# Patient Record
Sex: Female | Born: 1990 | Race: Black or African American | Hispanic: No | Marital: Single | Smoking: Current every day smoker
Health system: Southern US, Community
[De-identification: ages and names within clinical notes are randomized; demographics above are authoritative.]

## PROBLEM LIST (undated history)

## (undated) HISTORY — PX: HERNIA REPAIR: SHX51

---
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
## 2013-02-16 ENCOUNTER — Emergency Department (INDEPENDENT_AMBULATORY_CARE_PROVIDER_SITE_OTHER)
Admission: EM | Admit: 2013-02-16 | Discharge: 2013-02-16 | Disposition: A | Discharge status: Home / Self Care | Payer: Managed Care, Other (non HMO) | Source: Home / Self Care | Attending: Emergency Medicine | Admitting: Emergency Medicine

## 2013-02-16 ENCOUNTER — Encounter (HOSPITAL_COMMUNITY): Payer: Self-pay | Admitting: Emergency Medicine

## 2013-02-16 DIAGNOSIS — K1370 Unspecified lesions of oral mucosa: Secondary | ICD-10-CM

## 2013-02-16 DIAGNOSIS — N390 Urinary tract infection, site not specified: Secondary | ICD-10-CM

## 2013-02-16 DIAGNOSIS — K121 Other forms of stomatitis: Secondary | ICD-10-CM

## 2013-02-16 DIAGNOSIS — N912 Amenorrhea, unspecified: Secondary | ICD-10-CM

## 2013-02-16 LAB — POCT URINALYSIS DIP (DEVICE)
Bilirubin Urine: NEGATIVE
Glucose, UA: NEGATIVE mg/dL
KETONES UR: NEGATIVE mg/dL
Nitrite: POSITIVE — AB
PROTEIN: NEGATIVE mg/dL
Specific Gravity, Urine: 1.025 (ref 1.005–1.030)
Urobilinogen, UA: 0.2 mg/dL (ref 0.0–1.0)
pH: 6 (ref 5.0–8.0)

## 2013-02-16 LAB — POCT PREGNANCY, URINE: PREG TEST UR: NEGATIVE

## 2013-02-16 MED ORDER — HYDROCODONE-ACETAMINOPHEN 5-325 MG PO TABS: 1.0000 | ORAL_TABLET | ORAL | Status: DC | PRN

## 2013-02-16 MED ORDER — CEPHALEXIN 500 MG PO CAPS: 500.0000 mg | ORAL_CAPSULE | Freq: Three times a day (TID) | ORAL | Status: DC

## 2013-02-16 MED ORDER — CEFUROXIME AXETIL 500 MG PO TABS: 500.0000 mg | ORAL_TABLET | Freq: Two times a day (BID) | ORAL | Status: DC

## 2013-02-16 MED ORDER — VALACYCLOVIR HCL 1 G PO TABS: 1000.0000 mg | ORAL_TABLET | Freq: Two times a day (BID) | ORAL | Status: DC

## 2013-02-16 MED ORDER — VALACYCLOVIR HCL 1 G PO TABS: 1000.0000 mg | ORAL_TABLET | Freq: Three times a day (TID) | ORAL | Status: DC

## 2013-02-16 NOTE — ED Notes (Signed)
Call back number verified.

## 2013-02-16 NOTE — ED Provider Notes (Signed)
CSN: [id number]     Arrival date & time 02/16/13  1034 History   First MD Initiated Contact with Patient 02/16/13 1101     Chief Complaint  Patient presents with   Oral Swelling   (Consider location/radiation/quality/duration/timing/severity/associated sxs/prior Treatment) HPI Comments: 23 year old female presents complaining of mouth pain and amenorrhea. These are 2 separate complaints. She has not had a period since November and she worries that she may be pregnant. She has not done a urine pregnancy tests at home. Also, for 4 days, she is lip ulcerations and pain. This started when she accidentally bit her lower lip. Spread around the sides to the top lip, and she also has a small spot on her tongue. The area is very painful and swollen. She denies any other systemic symptoms. She has no rash. She is not taking any medications recently. She has never had this before. She has no history of cold sores.   History reviewed. No pertinent past medical history. History reviewed. No pertinent past surgical history. No family history on file. History  Substance Use Topics   Smoking status: Current Every Day Smoker    Types: Cigarettes   Smokeless tobacco: Not on file   Alcohol Use: Yes   OB History   Grav Para Term Preterm Abortions TAB SAB Ect Mult Living   1              Review of Systems  Constitutional: Negative for fever and chills.  HENT: Positive for mouth sores (and pain). Negative for sore throat.   Eyes: Negative for visual disturbance.  Respiratory: Negative for cough and shortness of breath.   Cardiovascular: Negative for chest pain, palpitations and leg swelling.  Gastrointestinal: Negative for nausea, vomiting and abdominal pain.  Endocrine: Negative for polydipsia and polyuria.  Genitourinary: Positive for menstrual problem. Negative for dysuria, urgency, frequency, vaginal discharge and genital sores.  Musculoskeletal: Negative for arthralgias and myalgias.  Skin:  Negative for rash.  Neurological: Negative for dizziness, weakness and light-headedness.    Allergies  Review of patient's allergies indicates no known allergies.  Home Medications   Current Outpatient Rx  Name  Route  Sig  Dispense  Refill   cefUROXime (CEFTIN) 500 MG tablet   Oral   Take 1 tablet (500 mg total) by mouth 2 (two) times daily with a meal.   20 tablet   0    HYDROcodone-acetaminophen (NORCO/VICODIN) 5-325 MG per tablet   Oral   Take 1 tablet by mouth every 4 (four) hours as needed for moderate pain.   20 tablet   0    valACYclovir (VALTREX) 1000 MG tablet   Oral   Take 1 tablet (1,000 mg total) by mouth 2 (two) times daily.   20 tablet   0    BP 135/96   Pulse 77   Temp(Src) 98.6 F (37 C) (Oral)   Resp 16   SpO2 100%   LMP 12/10/2012 Physical Exam  Nursing note and vitals reviewed. Constitutional: She is oriented to person, place, and time. Vital signs are normal. She appears well-developed and well-nourished. No distress.  HENT:  Head: Normocephalic and atraumatic.  Mouth/Throat: Oral lesions (multiple tender induated lesions on the upper and lower lip, and buccal mucosa ) present.  Neck: Normal range of motion. Neck supple.  Pulmonary/Chest: Effort normal. No respiratory distress.  Abdominal: Soft. Normal appearance. There is no tenderness.  Lymphadenopathy:       Head (right side): Tonsillar adenopathy present.  Head (left side): Tonsillar adenopathy present.    She has cervical adenopathy.       Right cervical: Superficial cervical adenopathy present.       Left cervical: Superficial cervical adenopathy present.       Left: Supraclavicular adenopathy present.  Neurological: She is alert and oriented to person, place, and time. She has normal strength. Coordination normal.  Skin: Skin is warm and dry. No rash noted. She is not diaphoretic.  Psychiatric: She has a normal mood and affect. Judgment normal.    ED Course  Procedures  (including critical care time) Labs Review Labs Reviewed  POCT URINALYSIS DIP (DEVICE) - Abnormal; Notable for the following:    Hgb urine dipstick TRACE (*)    Nitrite POSITIVE (*)    Leukocytes, UA TRACE (*)    All other components within normal limits  HERPES SIMPLEX VIRUS CULTURE  URINE CULTURE  POCT PREGNANCY, URINE   Imaging Review No results found.    MDM   1. Amenorrhea   2. Mouth ulcers   3. UTI (lower urinary tract infection)    Pt seen with attending Dr. Lorenz CoasterKeller.  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  UPT negative, f/u with PCP, no other clear cause seen here, f/u with PCP.  UA does indicated UTI, treating for that as well.    New Prescriptions   CEFUROXIME (CEFTIN) 500 MG TABLET    Take 1 tablet (500 mg total) by mouth 2 (two) times daily with a meal.   HYDROCODONE-ACETAMINOPHEN (NORCO/VICODIN) 5-325 MG PER TABLET    Take 1 tablet by mouth every 4 (four) hours as needed for moderate pain.   VALACYCLOVIR (VALTREX) 1000 MG TABLET    Take 1 tablet (1,000 mg total) by mouth 2 (two) times daily.     Graylon GoodZachary H Lyndsee Casa, PA-C 02/16/13 1200

## 2013-02-16 NOTE — ED Provider Notes (Signed)
Medical screening examination/treatment/procedure(s) were performed by non-physician practitioner and as supervising physician I was immediately available for consultation/collaboration.  Leslee Homeavid Kunta Hilleary, M.D.   Reuben Likesavid C Dawayne Ohair, MD 02/16/13 [phone]

## 2013-02-16 NOTE — ED Notes (Signed)
Pt c/o oral swelling onset 4 days w/blisters on upper/lower lips Sxs include pain/tender Also concerned about poss preg LMP = 12/10/12 She is alert w/no signs of acute distress.

## 2013-02-16 NOTE — Discharge Instructions (Signed)
Oral Ulcers Oral ulcers are painful, shallow sores around the lining of the mouth. They can affect the gums, the inside of the lips and the cheeks (sores on the outside of the lips and on the face are different). They typically first occur in school aged children and teenagers. Oral ulcers may also be called canker sores or cold sores. CAUSES  Canker sores and cold sores can be caused by many factors including:  Infection.  Injury.  Sun exposure.  Medications.  Emotional stress.  Food allergies.  Vitamin deficiencies.  Toothpastes containing sodium lauryl sulfate. The Herpes Virus can be the cause of mouth ulcers. The first infection can be severe and cause 10 or more ulcers on the gums, tongue and lips with fever and difficulty in swallowing. This infection usually occurs between the ages of 1 and 3 years.  SYMPTOMS  The typical sore is about  inch (6 mm) in size, is an oval or round ulcer with red borders. DIAGNOSIS  Your caregiver can diagnose simple oral ulcers by examination. Additional testing is usually not required.  TREATMENT  Treatment is aimed at pain relief. Generally, oral ulcers resolve by themselves within 1 to 2 weeks without medication and are not contagious unless caused by Herpes (and other viruses). Antibiotics are not effective with mouth sores. Avoid direct contact with others until the ulcer is completely healed. See your caregiver for follow-up care as recommended. Also:  Offer a soft diet.  Encourage plenty of fluids to prevent dehydration. Popsicles and milk shakes can be helpful.  Avoid acidic and salty foods and drinks such as orange juice.  Infants and young children will often refuse to drink because of pain. Using a teaspoon, cup or syringe to give small amounts of fluids frequently can help prevent dehydration.  Cold compresses on the face may help reduce pain.  Pain medication can help control soreness.  A solution of diphenhydramine mixed  with a liquid antacid can be useful to decrease the soreness of ulcers. Consult a caregiver for the dosing.  Liquids or ointments with a numbing ingredient may be helpful when used as recommended.  Older children and teenagers can rinse their mouth with a salt-water mixture (1/2 teaspoonof salt in 8 ounces of water) four times a day. This treatment is uncomfortable but may reduce the time the ulcers are present.  There are many over the counter throat lozenges and medications available for oral ulcers. There effectiveness has not been studied.  Consult your medical caregiver prior to using homeopathic treatments for oral ulcers. SEEK MEDICAL CARE IF:   You think your child needs to be seen.  The pain worsens and you cannot control it.  There are 4 or more ulcers.  The lips and gums begin to bleed and crust.  A single mouth ulcer is near a tooth that is causing a toothache or pain.  Your child has a fever, swollen face, or swollen glands.  The ulcers began after starting a medication.  Mouth ulcers keep re-occurring or last more than 2 weeks.  You think your child is not taking adequate fluids. SEEK IMMEDIATE MEDICAL CARE IF:   Your child has a high fever.  Your child is unable to swallow or becomes dehydrated.  Your child looks or acts very ill.  An ulcer caused by a chemical your child accidentally put in their mouth. Document Released: 02/20/2004 Document Revised: 04/06/2011 Document Reviewed: 10/04/2008 [facility] Patient Information 2014 [place], [place].  Secondary Amenorrhea  Secondary amenorrhea is  the stopping of menstrual flow for 3 6 months in a female who has previously had periods. There are many possible causes. Most of these causes are not serious. Usually, treating the underlying problem causing the loss of menses will return your periods to normal. CAUSES  Some common and uncommon causes of not menstruating include:  Malnutrition.  Low blood sugar  (hypoglycemia).  Polycystic ovary disease.  Stress or fear.  Breastfeeding.  Hormone imbalance.  Ovarian failure.  Medicines.  Extreme obesity.  Cystic fibrosis.  Low body weight or drastic weight reduction from any cause.  Early menopause.  Removal of ovaries or uterus.  Contraceptives.  Illness.  Long-term (chronic) illnesses.  Cushing syndrome.  Thyroid problems.  Birth control pills, patches, or vaginal rings for birth control. RISK FACTORS You may be at greater risk of secondary amenorrhea if:  You have a family history of this condition.  You have an eating disorder.  You do athletic training. DIAGNOSIS  A diagnosis is made by your health care provider taking a medical history and doing a physical exam. This will include a pelvic exam to check for problems with your reproductive organs. Pregnancy must be ruled out. Often, numerous blood tests are done to measure different hormones in the body. Urine testing may be done. Specialized exams (ultrasound, CT scan, MRI, or hysteroscopy) may have to be done as well as measuring the body mass index (BMI). TREATMENT  Treatment depends on the cause of the amenorrhea. If an eating disorder is present, this can be treated with an adequate diet and therapy. Chronic illnesses may improve with treatment of the illness. Amenorrhea may be corrected with medicines, lifestyle changes, or surgery. If the amenorrhea cannot be corrected, it is sometimes possible to create a false menstruation with medicines. HOME CARE INSTRUCTIONS  Maintain a healthy diet.  Manage weight problems.  Exercise regularly but not excessively.  Get adequate sleep.  Manage stress.  Be aware of changes in your menstrual cycle. Keep a record of when your periods occur. Note the date your period starts, how long it lasts, and any problems. SEEK MEDICAL CARE IF: Your symptoms do not get better with treatment. Document Released: 02/23/2006 Document  Revised: 09/14/2012 Document Reviewed: 06/30/2012 [facility] Patient Information 2014 [place].

## 2013-02-18 LAB — URINE CULTURE: Colony Count: >=100000

## 2013-02-19 NOTE — ED Notes (Signed)
Urine culture: >100,000 colonies E. Coli.  Pt. adequately treated with Ceftin.  Herpes culture pending. Vassie MoselleYork, Syerra Abdelrahman M 02/19/2013

## 2013-02-20 LAB — HERPES SIMPLEX VIRUS CULTURE

## 2013-02-21 ENCOUNTER — Telehealth (HOSPITAL_COMMUNITY): Payer: Self-pay | Admitting: *Deleted

## 2013-02-21 NOTE — ED Notes (Signed)
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  Pt. adequately treated with Valtrex.  I called pt. and left a message to call.  Call 1. Vassie MoselleYork, Estuardo Frisbee M 02/21/2013

## 2013-02-25 NOTE — ED Notes (Signed)
Unable to reach pt. by phone x 3.  Confidential marked letter sent with results and instructions. Vassie MoselleYork, Jeanelle Dake M 02/25/2013

## 2013-03-01 NOTE — ED Notes (Addendum)
Pt. called back.  States she has not received the letter yet.  States her phone was out of order and just got the messages.  Pt. verified x 2 and given results.  Pt. told she was adequately treated with Valtrex for the Herpes and Ceftin for the UTI.  Pt. instructed to notify her partner. You can pass the virus to a partner's genital area, even when you don't have an outbreak, so always practice safe sex. Get treated for each outbreak with Acyclovir or Valtrex. Pt. has a PCP she can f/u with, who can recheck her next outbreak for typing and call in a prescription for her when she has an outbreak. Vassie MoselleYork, Damondre Pfeifle M 03/01/2013

## 2013-03-03 NOTE — Laboratory Report (Signed)
°

## 2013-05-09 ENCOUNTER — Ambulatory Visit: Payer: Managed Care, Other (non HMO) | Admitting: Family Medicine

## 2013-09-27 ENCOUNTER — Encounter (HOSPITAL_COMMUNITY): Payer: Self-pay | Admitting: Emergency Medicine

## 2013-09-27 ENCOUNTER — Emergency Department (HOSPITAL_COMMUNITY)
Admission: EM | Admit: 2013-09-27 | Discharge: 2013-09-27 | Disposition: A | Discharge status: Home / Self Care | Payer: Managed Care, Other (non HMO) | Attending: Emergency Medicine | Admitting: Emergency Medicine

## 2013-09-27 ENCOUNTER — Emergency Department (HOSPITAL_COMMUNITY): Payer: Managed Care, Other (non HMO)

## 2013-09-27 DIAGNOSIS — Z791 Long term (current) use of non-steroidal anti-inflammatories (NSAID): Secondary | ICD-10-CM | POA: Diagnosis not present

## 2013-09-27 DIAGNOSIS — O9989 Other specified diseases and conditions complicating pregnancy, childbirth and the puerperium: Secondary | ICD-10-CM

## 2013-09-27 DIAGNOSIS — O99330 Smoking (tobacco) complicating pregnancy, unspecified trimester: Secondary | ICD-10-CM | POA: Insufficient documentation

## 2013-09-27 DIAGNOSIS — M259 Joint disorder, unspecified: Secondary | ICD-10-CM | POA: Insufficient documentation

## 2013-09-27 DIAGNOSIS — N390 Urinary tract infection, site not specified: Secondary | ICD-10-CM | POA: Diagnosis not present

## 2013-09-27 DIAGNOSIS — Z79899 Other long term (current) drug therapy: Secondary | ICD-10-CM | POA: Diagnosis not present

## 2013-09-27 DIAGNOSIS — O2390 Unspecified genitourinary tract infection in pregnancy, unspecified trimester: Secondary | ICD-10-CM | POA: Insufficient documentation

## 2013-09-27 DIAGNOSIS — M545 Low back pain, unspecified: Secondary | ICD-10-CM | POA: Insufficient documentation

## 2013-09-27 DIAGNOSIS — M899 Disorder of bone, unspecified: Secondary | ICD-10-CM

## 2013-09-27 DIAGNOSIS — Z3490 Encounter for supervision of normal pregnancy, unspecified, unspecified trimester: Secondary | ICD-10-CM

## 2013-09-27 LAB — URINALYSIS, ROUTINE W REFLEX MICROSCOPIC
BILIRUBIN URINE: NEGATIVE
GLUCOSE, UA: NEGATIVE mg/dL
HGB URINE DIPSTICK: NEGATIVE
Ketones, ur: NEGATIVE mg/dL
Nitrite: NEGATIVE
PH: 7.5 (ref 5.0–8.0)
Protein, ur: NEGATIVE mg/dL
Specific Gravity, Urine: 1.024 (ref 1.005–1.030)
UROBILINOGEN UA: 4 mg/dL — AB (ref 0.0–1.0)

## 2013-09-27 LAB — WET PREP, GENITAL
CLUE CELLS WET PREP: NONE SEEN
TRICH WET PREP: NONE SEEN
WBC, Wet Prep HPF POC: NONE SEEN
Yeast Wet Prep HPF POC: NONE SEEN

## 2013-09-27 LAB — URINE MICROSCOPIC-ADD ON

## 2013-09-27 LAB — HCG, QUANTITATIVE, PREGNANCY: hCG, Beta Chain, Quant, S: 90940 m[IU]/mL — ABNORMAL HIGH (ref <5)

## 2013-09-27 LAB — ABO/RH: ABO/RH(D): O POS

## 2013-09-27 LAB — POC URINE PREG, ED: PREG TEST UR: POSITIVE — AB

## 2013-09-27 MED ORDER — NITROFURANTOIN MONOHYD MACRO 100 MG PO CAPS: 100.0000 mg | ORAL_CAPSULE | Freq: Two times a day (BID) | ORAL | Status: DC

## 2013-09-27 NOTE — ED Provider Notes (Signed)
CSN: [id number]     Arrival date & time 09/27/13  1419 History   First MD Initiated Contact with Patient 09/27/13 1504     Chief Complaint  Patient presents with   Pregnancy Test    Back Pain     (Consider location/radiation/quality/duration/timing/severity/associated sxs/prior Treatment) HPI Comments: Patient presents with right back and lower abdominal pain. She took a home pregnancy test was positive. Her last visual. He was in mid July. This is her third pregnancy. She has one child and she had one prior elective abortion. She's had some constant aching pain to her right back and some mild pain to her right abdomen over the last 3-4 days. She denies any vaginal discharge. She denies any bleeding. She's had some nausea and vomiting. She's had a no burning on urination or frequency.  Patient is a 23 y.o. female presenting with back pain.  Back Pain Associated symptoms: abdominal pain   Associated symptoms: no chest pain, no fever, no headaches, no numbness and no weakness     History reviewed. No pertinent past medical history. No past surgical history on file. No family history on file. History  Substance Use Topics   Smoking status: Current Every Day Smoker    Types: Cigarettes   Smokeless tobacco: Not on file   Alcohol Use: Yes   OB History   Grav Para Term Preterm Abortions TAB SAB Ect Mult Living   1              Review of Systems  Constitutional: Negative for fever, chills, diaphoresis and fatigue.  HENT: Negative for congestion, rhinorrhea and sneezing.   Eyes: Negative.   Respiratory: Negative for cough, chest tightness and shortness of breath.   Cardiovascular: Negative for chest pain and leg swelling.  Gastrointestinal: Positive for nausea, vomiting and abdominal pain. Negative for diarrhea and blood in stool.  Genitourinary: Negative for frequency, hematuria, flank pain, vaginal bleeding, vaginal discharge and difficulty urinating.  Musculoskeletal: Positive  for back pain. Negative for arthralgias.  Skin: Negative for rash.  Neurological: Negative for dizziness, speech difficulty, weakness, numbness and headaches.      Allergies  Review of patient's allergies indicates no known allergies.  Home Medications   Prior to Admission medications   Medication Sig Start Date End Date Taking? Authorizing Provider  naproxen sodium (ANAPROX) 220 MG tablet Take 440-660 mg by mouth 2 (two) times daily with a meal.   Yes Historical Provider, MD  nitrofurantoin, macrocrystal-monohydrate, (MACROBID) 100 MG capsule Take 1 capsule (100 mg total) by mouth 2 (two) times daily. X 7 days 09/27/13   Rolan Bucco, MD   BP 113/65   Pulse 91   Temp(Src) 98 F (36.7 C) (Oral)   Resp 16   SpO2 100%   LMP 08/09/2013 Physical Exam  Constitutional: She is oriented to person, place, and time. She appears well-developed and well-nourished.  HENT:  Head: Normocephalic and atraumatic.  Eyes: Pupils are equal, round, and reactive to light.  Neck: Normal range of motion. Neck supple.  Cardiovascular: Normal rate, regular rhythm and normal heart sounds.   Pulmonary/Chest: Effort normal and breath sounds normal. No respiratory distress. She has no wheezes. She has no rales. She exhibits no tenderness.  Abdominal: Soft. Bowel sounds are normal. There is tenderness (mild TTP right lower abd). There is no rebound and no guarding.  Mild TTP right lower lumbar area  Musculoskeletal: Normal range of motion. She exhibits no edema.  Lymphadenopathy:    She has  no cervical adenopathy.  Neurological: She is alert and oriented to person, place, and time.  Skin: Skin is warm and dry. No rash noted.  Psychiatric: She has a normal mood and affect.    ED Course  Procedures (including critical care time) Labs Review Results for orders placed during the hospital encounter of 09/27/13  WET PREP, GENITAL      Result Value Ref Range   Yeast Wet Prep HPF POC NONE SEEN  NONE SEEN   Trich,  Wet Prep NONE SEEN  NONE SEEN   Clue Cells Wet Prep HPF POC NONE SEEN  NONE SEEN   WBC, Wet Prep HPF POC NONE SEEN  NONE SEEN  URINALYSIS, ROUTINE W REFLEX MICROSCOPIC      Result Value Ref Range   Color, Urine YELLOW  YELLOW   APPearance TURBID (*) CLEAR   Specific Gravity, Urine 1.024  1.005 - 1.030   pH 7.5  5.0 - 8.0   Glucose, UA NEGATIVE  NEGATIVE mg/dL   Hgb urine dipstick NEGATIVE  NEGATIVE   Bilirubin Urine NEGATIVE  NEGATIVE   Ketones, ur NEGATIVE  NEGATIVE mg/dL   Protein, ur NEGATIVE  NEGATIVE mg/dL   Urobilinogen, UA 4.0 (*) 0.0 - 1.0 mg/dL   Nitrite NEGATIVE  NEGATIVE   Leukocytes, UA MODERATE (*) NEGATIVE  HCG, QUANTITATIVE, PREGNANCY      Result Value Ref Range   hCG, Beta Chain, Quant, S 90940 (*) <5 mIU/mL  URINE MICROSCOPIC-ADD ON      Result Value Ref Range   Squamous Epithelial / LPF RARE  RARE   WBC, UA 11-20  <3 WBC/hpf  POC URINE PREG, ED      Result Value Ref Range   Preg Test, Ur POSITIVE (*) NEGATIVE  ABO/RH      Result Value Ref Range   ABO/RH(D) O POS     No rh immune globuloin NOT A RH IMMUNE GLOBULIN CANDIDATE, PT RH POSITIVE     No results found.    Imaging Review US Ob Comp Less 14 Wks  09/27/2013   CLINICAL DATA:  Positive pregnancy test.  EXAM: OBSTETRIC <14 WK [place] AND TRANSVAGINAL OB US  TECHNIQUE: Both transabdominal and transvaginal ultrasound examinations were performed for complete evaluation of the gestation as well as the maternal uterus, adnexal regions, and pelvic cul-de-sac. Transvaginal technique was performed to assess early pregnancy.  COMPARISON:  No recent.  FINDINGS: Intrauterine gestational sac: Visualized/normal in shape.  Yolk sac:  Visualized.  Embryo:  Visualized.  Cardiac Activity: Visualized.  Heart Rate:  144 bpm  CRL:   1.2 cm 7 w 3 d                  [place] EDC: 05/12/2013.  Maternal uterus/adnexae: 2.8 cm thick wall cyst left ovary, most likely corpus luteal cyst. Trace free pelvic fluid.  IMPRESSION: Single viable  intrauterine pregnancy at 7 weeks 3 days.   Electronically Signed   By: Maisie Fus  Register   On: 09/27/2013 16:29   US Ob Transvaginal  09/27/2013   CLINICAL DATA:  Positive pregnancy test.  EXAM: OBSTETRIC <14 WK [place] AND TRANSVAGINAL OB US  TECHNIQUE: Both transabdominal and transvaginal ultrasound examinations were performed for complete evaluation of the gestation as well as the maternal uterus, adnexal regions, and pelvic cul-de-sac. Transvaginal technique was performed to assess early pregnancy.  COMPARISON:  No recent.  FINDINGS: Intrauterine gestational sac: Visualized/normal in shape.  Yolk sac:  Visualized.  Embryo:  Visualized.  Cardiac  Activity: Visualized.  Heart Rate:  144 bpm  CRL:   1.2 cm 7 w 3 d                  [place] EDC: 05/12/2013.  Maternal uterus/adnexae: 2.8 cm thick wall cyst left ovary, most likely corpus luteal cyst. Trace free pelvic fluid.  IMPRESSION: Single viable intrauterine pregnancy at 7 weeks 3 days.   Electronically Signed   By: Maisie Fus  Register   On: 09/27/2013 16:29     EKG Interpretation None      MDM   Final diagnoses:  UTI (lower urinary tract infection)  Pregnancy    Patient with intrauterine pregnancy at 7 weeks 3 days. There is no evident complications. She has some back pain and right lower quadrant pain and evidence of a urinary tract infection on her urinalysis. Given this I will treat her with Macrobid. I discussed with her the importance of starting a prenatal vitamin and getting early prenatal care. She's going to attempt to find an OB/GYN. Mikael Spray return if her symptoms worsen.    Rolan Bucco, MD 09/27/13 [phone]

## 2013-09-27 NOTE — Discharge Instructions (Signed)
First Trimester of Pregnancy The first trimester of pregnancy is from week 1 until the end of week 12 (months 1 through 3). During this time, your baby will begin to develop inside you. At 6-8 weeks, the eyes and face are formed, and the heartbeat can be seen on ultrasound. At the end of 12 weeks, all the baby's organs are formed. Prenatal care is all the medical care you receive before the birth of your baby. Make sure you get good prenatal care and follow all of your doctor's instructions. HOME CARE  Medicines  Take medicine only as told by your doctor. Some medicines are safe and some are not during pregnancy.  Take your prenatal vitamins as told by your doctor.  Take medicine that helps you poop (stool softener) as needed if your doctor says it is okay. Diet  Eat regular, healthy meals.  Your doctor will tell you the amount of weight gain that is right for you.  Avoid raw meat and uncooked cheese.  If you feel sick to your stomach (nauseous) or throw up (vomit):  Eat 4 or 5 small meals a day instead of 3 large meals.  Try eating a few soda crackers.  Drink liquids between meals instead of during meals.  If you have a hard time pooping (constipation):  Eat high-fiber foods like fresh vegetables, fruit, and whole grains.  Drink enough fluids to keep your pee (urine) clear or pale yellow. Activity and Exercise  Exercise only as told by your doctor. Stop exercising if you have cramps or pain in your lower belly (abdomen) or low back.  Try to avoid standing for long periods of time. Move your legs often if you must stand in one place for a long time.  Avoid heavy lifting.  Wear low-heeled shoes. Sit and stand up straight.  You can have sex unless your doctor tells you not to. Relief of Pain or Discomfort  Wear a good support bra if your breasts are sore.  Take warm water baths (sitz baths) to soothe pain or discomfort caused by hemorrhoids. Use hemorrhoid cream if your  doctor says it is okay.  Rest with your legs raised if you have leg cramps or low back pain.  Wear support hose if you have puffy, bulging veins (varicose veins) in your legs. Raise (elevate) your feet for 15 minutes, 3-4 times a day. Limit salt in your diet. Prenatal Care  Schedule your prenatal visits by the twelfth week of pregnancy.  Write down your questions. Take them to your prenatal visits.  Keep all your prenatal visits as told by your doctor. Safety  Wear your seat belt at all times when driving.  Make a list of emergency phone numbers. The list should include numbers for family, friends, the hospital, and police and fire departments. General Tips  Ask your doctor for a referral to a local prenatal class. Begin classes no later than at the start of month 6 of your pregnancy.  Ask for help if you need counseling or help with nutrition. Your doctor can give you advice or tell you where to go for help.  Do not use hot tubs, steam rooms, or saunas.  Do not douche or use tampons or scented sanitary pads.  Do not cross your legs for long periods of time.  Avoid litter boxes and soil used by cats.  Avoid all smoking, herbs, and alcohol. Avoid drugs not approved by your doctor.  Visit your dentist. At home, brush your teeth  with a soft toothbrush. Be gentle when you floss. °GET HELP IF: °· You are dizzy. °· You have mild cramps or pressure in your lower belly. °· You have a nagging pain in your belly area. °· You continue to feel sick to your stomach, throw up, or have watery poop (diarrhea). °· You have a bad smelling fluid coming from your vagina. °· You have pain with peeing (urination). °· You have increased puffiness (swelling) in your face, hands, legs, or ankles. °GET HELP RIGHT AWAY IF:  °· You have a fever. °· You are leaking fluid from your vagina. °· You have spotting or bleeding from your vagina. °· You have very bad belly cramping or pain. °· You gain or lose weight  rapidly. °· You throw up blood. It may look like coffee grounds. °· You are around people who have German measles, fifth disease, or chickenpox. °· You have a very bad headache. °· You have shortness of breath. °· You have any kind of trauma, such as from a fall or a car accident. °Document Released: 07/01/2007 Document Revised: 05/29/2013 Document Reviewed: 11/22/2012 °[employer]® Patient Information ©2015 [employer]. This information is not intended to replace advice given to you by your health care provider. Make sure you discuss any questions you have with your health care provider. ° °Folic Acid in Pregnancy °Folic acid is a B vitamin that helps prevent neural tube defects (NTDs). The neural tube is the part of a developing baby that becomes the brain and spinal cord. When the neural tube does not close properly, a baby is born with an NTD. NTDs include spina bifida, hernia of the spinal cord, and the absence of part or all of the brain (anencephaly).  °Take folic acid at least 4 weeks before getting pregnant and through the first 3 months of pregnancy. This is when the neural tube is developing. It is available in most multivitamins, as a folic-acid-only supplement, and in some foods. Taking the right amount of folic acid before conception and during pregnancy lessens the chance of having a baby born with an NTD. Giving folic acid will not affect a neural tube defect if it is already present. °DIAGNOSIS  °· An alpha fetoprotein (AFP) blood or amniotic fluid test will show high levels of the alpha fetoprotein if a woman is carrying a baby with an NTD. This test is done on all pregnant women in the first trimester. °· An ultrasound may detect an NTD. °WHAT YOU CAN DO: °· Take a multivitamin with at least 0.4 milligrams (400 micrograms) of folic acid daily at least 4 weeks before getting pregnant and through the first 12 weeks of pregnancy. °· If you have already had a pregnancy affected by an NTD, take 4  milligrams (4,000 micrograms) of folic acid daily. Take this amount 1 month before you start trying to get pregnant and continue through the first 3 months of pregnancy. °If you have a seizure disorder or take medicines to control seizures, tell your maternity care provider. Continue to take your folic acid unless you are told otherwise.   °FOLIC ACID IN FOODS °Eat a healthy diet that has foods that contain folic acid, the natural form of the vitamin. Such foods include: °· Fortified breakfast cereals. °· Lentils. °· Asparagus. °· Spinach. °· Organ meats (liver). °· Black beans. °· Peanuts (eat only if you do not have a peanut allergy). °· Broccoli. °· Strawberries, oranges. °· Orange juice (from concentrate is best). °· Enriched breads and pasta. °·   Romaine lettuce. TALK TO YOUR HEALTH CARE PROVIDER IF:  You are in your first trimester and have high blood sugar.  You are in your first trimester and develop a high fever. In almost all cases, a fetus found to have an NTD will need specialized care that may not be available in all hospitals. Talk to your health care provider about what is best for you and your baby. Document Released: 01/15/2003 Document Revised: 05/29/2013 Document Reviewed: 04/17/2009 [facility] Patient Information 2015 [place], [place]. This information is not intended to replace advice given to you by your health care provider. Make sure you discuss any questions you have with your health care provider.  Pregnancy and Urinary Tract Infection A urinary tract infection (UTI) is a bacterial infection of the urinary tract. Infection of the urinary tract can include the ureters, kidneys (pyelonephritis), bladder (cystitis), and urethra (urethritis). All pregnant women should be screened for bacteria in the urinary tract. Identifying and treating a UTI will decrease the risk of preterm labor and developing more serious infections in both the mother and baby. CAUSES Bacteria germs cause almost  all UTIs.  RISK FACTORS Many factors can increase your chances of getting a UTI during pregnancy. These include:  Having a short urethra.  Poor toilet and hygiene habits.  Sexual intercourse.  Blockage of urine along the urinary tract.  Problems with the pelvic muscles or nerves.  Diabetes.  Obesity.  Bladder problems after having several children.  Previous history of UTI. SIGNS AND SYMPTOMS   Pain, burning, or a stinging feeling when urinating.  Suddenly feeling the need to urinate right away (urgency).  Loss of bladder control (urinary incontinence).  Frequent urination, more than is common with pregnancy.  Lower abdominal or back discomfort.  Cloudy urine.  Blood in the urine (hematuria).  Fever. When the kidneys are infected, the symptoms may be:  Back pain.  Flank pain on the right side more so than the left.  Fever.  Chills.  Nausea.  Vomiting. DIAGNOSIS  A urinary tract infection is usually diagnosed through urine tests. Additional tests and procedures are sometimes done. These may include:  Ultrasound exam of the kidneys, ureters, bladder, and urethra.  Looking in the bladder with a lighted tube (cystoscopy). TREATMENT Typically, UTIs can be treated with antibiotic medicines.  HOME CARE INSTRUCTIONS   Only take over-the-counter or prescription medicines as directed by your health care provider. If you were prescribed antibiotics, take them as directed. Finish them even if you start to feel better.  Drink enough fluids to keep your urine clear or pale yellow.  Do not have sexual intercourse until the infection is gone and your health care provider says it is okay.  Make sure you are tested for UTIs throughout your pregnancy. These infections often come back. Preventing a UTI in the Future  Practice good toilet habits. Always wipe from front to back. Use the tissue only once.  Do not hold your urine. Empty your bladder as soon as  possible when the urge comes.  Do not douche or use deodorant sprays.  Wash with soap and warm water around the genital area and the anus.  Empty your bladder before and after sexual intercourse.  Wear underwear with a cotton crotch.  Avoid caffeine and carbonated drinks. They can irritate the bladder.  Drink cranberry juice or take cranberry pills. This may decrease the risk of getting a UTI.  Do not drink alcohol.  Keep all your appointments and tests as scheduled.  SEEK MEDICAL CARE IF:   Your symptoms get worse.  You are still having fevers 2 or more days after treatment begins.  You have a rash.  You feel that you are having problems with medicines prescribed.  You have abnormal vaginal discharge. SEEK IMMEDIATE MEDICAL CARE IF:   You have back or flank pain.  You have chills.  You have blood in your urine.  You have nausea and vomiting.  You have contractions of your uterus.  You have a gush of fluid from the vagina. MAKE SURE YOU:  Understand these instructions.   Will watch your condition.   Will get help right away if you are not doing well or get worse.  Document Released: 05/09/2010 Document Revised: 11/02/2012 Document Reviewed: 08/11/2012 [facility] Patient Information 2015 [place], [place]. This information is not intended to replace advice given to you by your health care provider. Make sure you discuss any questions you have with your health care provider.

## 2013-09-27 NOTE — ED Notes (Signed)
Lab called and pt is O+ and not a candidate for rhogam.

## 2013-09-27 NOTE — ED Notes (Addendum)
Pt complaint of nausea/vomiting for a week and mid to lower back pain for 3 days. Pt denies GU or vaginal symptoms.

## 2013-09-27 NOTE — Procedures (Signed)
°

## 2013-09-27 NOTE — ED Notes (Signed)
Pt requesting pregnancy test.  "I took two so I just need confirmation."  Also c/o mid back pain x 3 days.  Denies dysuria or vaginal discharge.  Vomiting x 1 wk.

## 2013-09-28 LAB — GC/CHLAMYDIA PROBE AMP
CT Probe RNA: NEGATIVE
GC Probe RNA: NEGATIVE

## 2013-11-27 ENCOUNTER — Encounter (HOSPITAL_COMMUNITY): Payer: Self-pay | Admitting: Emergency Medicine

## 2014-05-17 ENCOUNTER — Emergency Department (HOSPITAL_COMMUNITY): Payer: Medicaid Other

## 2014-05-17 ENCOUNTER — Encounter (HOSPITAL_COMMUNITY): Payer: Self-pay | Admitting: Emergency Medicine

## 2014-05-17 ENCOUNTER — Emergency Department (HOSPITAL_COMMUNITY)
Admission: EM | Admit: 2014-05-17 | Discharge: 2014-05-17 | Disposition: A | Discharge status: Home / Self Care | Payer: Medicaid Other | Attending: Emergency Medicine | Admitting: Emergency Medicine

## 2014-05-17 DIAGNOSIS — M62838 Other muscle spasm: Secondary | ICD-10-CM

## 2014-05-17 DIAGNOSIS — Y92410 Unspecified street and highway as the place of occurrence of the external cause: Secondary | ICD-10-CM | POA: Insufficient documentation

## 2014-05-17 DIAGNOSIS — S199XXA Unspecified injury of neck, initial encounter: Secondary | ICD-10-CM | POA: Diagnosis not present

## 2014-05-17 DIAGNOSIS — S0993XA Unspecified injury of face, initial encounter: Secondary | ICD-10-CM | POA: Diagnosis present

## 2014-05-17 DIAGNOSIS — Y9389 Activity, other specified: Secondary | ICD-10-CM | POA: Diagnosis not present

## 2014-05-17 DIAGNOSIS — Z880 Allergy status to penicillin: Secondary | ICD-10-CM | POA: Diagnosis not present

## 2014-05-17 DIAGNOSIS — Z791 Long term (current) use of non-steroidal anti-inflammatories (NSAID): Secondary | ICD-10-CM | POA: Insufficient documentation

## 2014-05-17 DIAGNOSIS — Y998 Other external cause status: Secondary | ICD-10-CM | POA: Diagnosis not present

## 2014-05-17 DIAGNOSIS — Z720 Tobacco use: Secondary | ICD-10-CM | POA: Insufficient documentation

## 2014-05-17 DIAGNOSIS — R6884 Jaw pain: Secondary | ICD-10-CM

## 2014-05-17 MED ORDER — HYDROCODONE-ACETAMINOPHEN 5-325 MG PO TABS: 1.0000 | ORAL_TABLET | ORAL | Status: DC | PRN

## 2014-05-17 MED ORDER — METHOCARBAMOL 500 MG PO TABS: 500.0000 mg | ORAL_TABLET | Freq: Two times a day (BID) | ORAL | Status: DC
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
## 2014-05-17 NOTE — Discharge Instructions (Signed)
Take the prescribed medication as directed. °Follow-up with dentist.  Resource guide attached to help with this. °Return to the ED for new or worsening symptoms. ° ° °Emergency Department Resource Guide °1) Find a Doctor and Pay Out of Pocket °Although you won't have to find out who is covered by your insurance plan, it is a good idea to ask around and get recommendations. You will then need to call the office and see if the doctor you have chosen will accept you as a new patient and what types of options they offer for patients who are self-pay. Some doctors offer discounts or will set up payment plans for their patients who do not have insurance, but you will need to ask so you aren't surprised when you get to your appointment. ° °2) Contact Your Local Health Department °Not all health departments have doctors that can see patients for sick visits, but many do, so it is worth a call to see if yours does. If you don't know where your local health department is, you can check in your phone book. The CDC also has a tool to help you locate your state's health department, and many state websites also have listings of all of their local health departments. ° °3) Find a Walk-in Clinic °If your illness is not likely to be very severe or complicated, you may want to try a walk in clinic. These are popping up all over the country in pharmacies, drugstores, and shopping centers. They're usually staffed by nurse practitioners or physician assistants that have been trained to treat common illnesses and complaints. They're usually fairly quick and inexpensive. However, if you have serious medical issues or chronic medical problems, these are probably not your best option. ° °No Primary Care Doctor: °- Call Health Connect at  832-8000 - they can help you locate a primary care doctor that  accepts your insurance, provides certain services, etc. °- Physician Referral Service- 1-800-533-3463 ° °Chronic Pain  Problems: °Organization         Address  Phone   Notes  °[facility]  (336) 297-2271 Patients need to be referred by their primary care doctor.  ° °Medication Assistance: °Organization         Address  Phone   Notes  °[place] [facility] [address] °[place], [place] [address] (336) 641-8030 --Must be a resident of [place] °-- Must have NO insurance coverage whatsoever (no Medicaid/ Medicare, etc.) °-- The pt. MUST have a primary care doctor that directs their care regularly and follows them in the community °  °[facility]  (866) 331-1348   °United Way  (888) 892-1162   ° °Agencies that provide inexpensive medical care: °Organization         Address  Phone   Notes  °[facility]  (336) 832-8035   °[facility]    (336) 832-7272   °[facility] [address] °[place], [place] [address] (336) 832-4777   °[facility] [address], °[place] (336) 271-4999   °[facility] Parenthood    (336) 373-0678   °[facility]    (336) 272-1050   °[facility] ° [address], [place] Phone:  (336) 832-4444, Fax:  (336) 832-4440 Hours of Operation:  9 am - 6 pm, M-F.  Also accepts Medicaid/Medicare and self-pay.  °[facility] ° [address],   [place] Phone: [phone], Fax: [phone]. Hours of Operation:  8:30 am - 5:30 pm, M-F.  Also accepts Medicaid and self-pay.  [facility] [place] [address] [place] Phone: [phone]   [place], [place], [place] [phone], Ext. [phone] Mondays & Thursdays: 7-9 AM.  First 15 patients are seen on a first come, first serve basis.    [place] Providers:  Organization         Address  Phone   Notes  [facility] [address],  [phone] Also  accepts self-pay patients.  [facility] [address], [place]  [phone]   [place], [address], [place] [phone]   [facility] [address] [place] [phone]   Lucianne Lei [address], [place]   [phone] Only accepts [place] Access [place] patients after they have their name applied to their card.   Self-Pay (no insurance) in [facility]:  Organization         Address  Phone   Notes  Sickle Cell Patients, [place] [facility] [place] [phone]   [facility] [place] [phone]   [facility] [place]  [place], [place], [place] [phone]   [facility]/Dr. Osei-Bonsu  [address], [place] or [address], [place] [phone] Phone number for both [place] and [place] locations is the same.  [facility] [address] [place] [phone]   [facility] [address], [place] or [address] [phone]   [facility] [address], [place] [phone], phone; [phone], fax Sees patients 1st and 3rd Saturday of every month.  Must not qualify for public or private insurance (i.e. Medicaid, Medicare, [place] Health Choice, Veterans' Benefits)  Household income should be no more than 200% of the poverty level The clinic cannot treat you if you are pregnant or think you are pregnant  Sexually transmitted diseases are not treated at the clinic.    Dental Care: Organization         Address  Phone  Notes  [facility] [place] ([phone] Accepts children up to age 62 who are enrolled in [place] or [place]; pregnant  women with a Medicaid card; and children who have applied for Medicaid or [place] Health Choice, but were declined, whose parents can pay a reduced fee at time of service.  [facility]  [address], [place] [phone] Accepts children up to age 60 who are enrolled in [place] or [place]; pregnant women with a Medicaid card; and children who have applied for Medicaid or [place] Health Choice, but were declined, whose parents can pay a reduced fee at time of service.  [facility]  [place] ([phone] Patients are seen by appointment only. Walk-ins are not accepted. [place] will see patients 15 years of age and older. Monday - Tuesday (8am-5pm) Most Wednesdays (8:30-5pm) $30 per visit, cash only  [place]  [place]  [address], [facility] [phone] Patients are seen by appointment only. Walk-ins are not accepted. [place] will see patients 69 years of age and older. One Wednesday Evening (Monthly: Volunteer Based).  $30 per visit, cash only  [place]  [phone] for adults; Children under age 84, call Graduate Pediatric Dentistry at [phone]. Children aged 22-14, please call [phone] to request a pediatric application.  Dental services are provided in all areas of dental care including fillings, crowns and bridges, complete and partial dentures, implants, gum treatment, root canals, and extractions. Preventive care is also provided. Treatment is provided to both adults and children. Patients are selected via a lottery and there is often a waiting list.   [facility] [address] [place]  [phone] [email or web address]   [facility] [address] [place], [place] [phone], Ext. [phone] Second and Fourth Thursday of each month, opens at 6:30 AM; Clinic ends at 9 AM.  Patients are  seen on a first-come first-served basis, and a limited number are seen during each clinic.   [facility]  [address] [place], [place] [phone]   Eligibility Requirements You must have lived in [place], [place], or [place] counties for at least the last three months.   You cannot be eligible for state or federal sponsored [employer], including [employer], [place], or [employer].   You generally cannot be eligible for healthcare insurance through your employer.    How to apply: Eligibility screenings are held every Tuesday and Wednesday afternoon from 1:00 pm until 4:00 pm. You do not need an appointment for the interview!  [facility] [address] [place], [place]   [place]  [place] Department  [place]  ([phone]) [phone]    Behavioral Health Resources in the Community: Intensive Outpatient Programs Organization         Address  Phone  Notes  [facility] [address], [place], [place] [phone]   [facility] [address], [place], [place]   [facility] [address] [place], [place]   [place] [address]  [place], [place] or (640)215-4408   Substance Abuse Resources Organization         Address  Phone  Notes  [facility]  (931)790-0601   [place]  (512) [phone]   [facility] [place]   [place]inita Pester  (434)089-1461   Residential & Outpatient Substance Abuse Program  [phone]   Psychological Services Organization         Address  Phone  Notes  [facility] [place]  [place]  [phone]   [place] [address], [place] or [phone]    Mobile Crisis  Teams Organization         Address  Phone  Notes  [facility], Mobile Crisis Care Unit  [phone]   [facility]  [address] [place], [place]   [place] [address] [place] (718)050-4296    Self-Help/Support Groups Organization         Address  Phone             Notes  [place]. of [place] - variety  of support groups  336- 373-1402 Call for more information  °Narcotics Anonymous (NA), [facility] [address], °[place]  2 meetings at this location  ° °Residential Treatment Programs °Organization         Address  Phone  Notes  °[facility] [address],    °[place]  1-866-801-8205   °[facility] ° [address], [place], [place] [phone]   °[facility] [address], [place] [phone] Admissions: 8am-3pm M-F  °[facility] [address]    °[place], [place] 336-841-1104   °[facility] [address], [place], [place] [phone]   °[facility] [address]  °[place], [place] 336-285-9073   °[facility] - Intensive Outpatient [address], [place], [place] 336-852-3033   °[facility] [address]  °[place], [place] 1-877-615-2722 or 336-784-9470   °[facility] ([facility]) [address] [place], [place] 336-227-7417 Accepts Medicaid  °[facility] [address]  °[place] 1-800-659-3381 Substance Abuse/Addiction Treatment  ° °[place] Behavioral Health Resources °Organization         Address  Phone  Notes  °[facility]  (888) 581-9988   °Julie Brannon, PhD [address] [place], [place]   (336) 349-5553 or (336) 951-0000   °[facility]   [address] °[place], [place] (336) 349-4454   °[facility] [address], [place], [place] (336) 342-8316  Insurance/Medicaid/sponsorship through [facility]  °[facility] [address]                                    [place], [place] (336) [phone] Therapy/tele-psych/case  °[facility] [address]  ° [place], [place] (336) 349-2233    °Dr. Arfeen  (336) 349-4544   °[facility] [place]  [employer] [place] [facility] 1) [address], [place] °2) [address], [place] °3)  [address], [place] (336) 349-3220 °(336) 342-7768 ° °(336) 342-8140   °[place] Child Abuse Hotline (336) 342-1394 or (336) 342-3537 (After Hours)    ° ° ° °

## 2014-05-17 NOTE — ED Notes (Signed)
Patient has lip laceration at the left corner of lips. Bleeding controlled.

## 2015-05-27 ENCOUNTER — Emergency Department (HOSPITAL_COMMUNITY)
Admission: EM | Admit: 2015-05-27 | Discharge: 2015-05-27 | Disposition: A | Discharge status: Home / Self Care | Payer: Medicaid Other | Attending: Emergency Medicine | Admitting: Emergency Medicine

## 2015-05-27 ENCOUNTER — Emergency Department (HOSPITAL_COMMUNITY): Payer: Medicaid Other

## 2015-05-27 ENCOUNTER — Encounter (HOSPITAL_COMMUNITY): Payer: Self-pay | Admitting: Emergency Medicine

## 2015-05-27 DIAGNOSIS — R05 Cough: Secondary | ICD-10-CM | POA: Diagnosis not present

## 2015-05-27 DIAGNOSIS — H538 Other visual disturbances: Secondary | ICD-10-CM | POA: Diagnosis not present

## 2015-05-27 DIAGNOSIS — F17210 Nicotine dependence, cigarettes, uncomplicated: Secondary | ICD-10-CM | POA: Diagnosis not present

## 2015-05-27 DIAGNOSIS — Z202 Contact with and (suspected) exposure to infections with a predominantly sexual mode of transmission: Secondary | ICD-10-CM | POA: Insufficient documentation

## 2015-05-27 DIAGNOSIS — Z880 Allergy status to penicillin: Secondary | ICD-10-CM | POA: Insufficient documentation

## 2015-05-27 DIAGNOSIS — H9312 Tinnitus, left ear: Secondary | ICD-10-CM

## 2015-05-27 DIAGNOSIS — Z711 Person with feared health complaint in whom no diagnosis is made: Secondary | ICD-10-CM

## 2015-05-27 DIAGNOSIS — R059 Cough, unspecified: Secondary | ICD-10-CM

## 2015-05-27 LAB — WET PREP, GENITAL
Sperm: NONE SEEN
TRICH WET PREP: NONE SEEN
Yeast Wet Prep HPF POC: NONE SEEN

## 2015-05-27 LAB — GC/CHLAMYDIA PROBE AMP (~~LOC~~) NOT AT ARMC
Chlamydia: NEGATIVE
NEISSERIA GONORRHEA: NEGATIVE

## 2015-05-27 LAB — HIV ANTIBODY (ROUTINE TESTING W REFLEX): HIV SCREEN 4TH GENERATION: NONREACTIVE

## 2015-05-27 MED ORDER — BENZONATATE 100 MG PO CAPS: 100.0000 mg | ORAL_CAPSULE | Freq: Three times a day (TID) | ORAL | Status: DC

## 2015-05-27 MED ORDER — BENZONATATE 100 MG PO CAPS: 100.0000 mg | ORAL_CAPSULE | Freq: Three times a day (TID) | ORAL | Status: DC | PRN

## 2015-05-27 NOTE — Discharge Instructions (Signed)
Cough, Adult Coughing is a reflex that clears your throat and your airways. Coughing helps to heal and protect your lungs. It is normal to cough occasionally, but a cough that happens with other symptoms or lasts a long time may be a sign of a condition that needs treatment. A cough may last only 2-3 weeks (acute), or it may last longer than 8 weeks (chronic). CAUSES Coughing is commonly caused by:  Breathing in substances that irritate your lungs.  A viral or bacterial respiratory infection.  Allergies.  Asthma.  Postnasal drip.  Smoking.  Acid backing up from the stomach into the esophagus (gastroesophageal reflux).  Certain medicines.  Chronic lung problems, including COPD (or rarely, lung cancer).  Other medical conditions such as heart failure. HOME CARE INSTRUCTIONS  Pay attention to any changes in your symptoms. Take these actions to help with your discomfort:  Take medicines only as told by your health care provider.  If you were prescribed an antibiotic medicine, take it as told by your health care provider. Do not stop taking the antibiotic even if you start to feel better.  Talk with your health care provider before you take a cough suppressant medicine.  Drink enough fluid to keep your urine clear or pale yellow.  If the air is dry, use a cold steam vaporizer or humidifier in your bedroom or your home to help loosen secretions.  Avoid anything that causes you to cough at work or at home.  If your cough is worse at night, try sleeping in a semi-upright position.  Avoid cigarette smoke. If you smoke, quit smoking. If you need help quitting, ask your health care provider.  Avoid caffeine.  Avoid alcohol.  Rest as needed. SEEK MEDICAL CARE IF:   You have new symptoms.  You cough up pus.  Your cough does not get better after 2-3 weeks, or your cough gets worse.  You cannot control your cough with suppressant medicines and you are losing sleep.  You  develop pain that is getting worse or pain that is not controlled with pain medicines.  You have a fever.  You have unexplained weight loss.  You have night sweats. SEEK IMMEDIATE MEDICAL CARE IF:  You cough up blood.  You have difficulty breathing.  Your heartbeat is very fast.   This information is not intended to replace advice given to you by your health care provider. Make sure you discuss any questions you have with your health care provider.   Document Released: 07/11/2010 Document Revised: 10/03/2014 Document Reviewed: 03/21/2014 Elsevier Interactive Patient Education 2016 [employer].  Tinnitus Tinnitus refers to hearing a sound when there is no actual source for that sound. This is often described as ringing in the ears. However, people with this condition may hear a variety of noises. A person may hear the sound in one ear or in both ears.  The sounds of tinnitus can be soft, loud, or somewhere in between. Tinnitus can last for a few seconds or can be constant for days. It may go away without treatment and come back at various times. When tinnitus is constant or happens often, it can lead to other problems, such as trouble sleeping and trouble concentrating. Almost everyone experiences tinnitus at some point. Tinnitus that is long-lasting (chronic) or comes back often is a problem that may require medical attention.  CAUSES  The cause of tinnitus is often not known. In some cases, it can result from other problems or conditions, including:  Exposure to loud noises from machinery, music, or other sources.  Hearing loss.  Ear or sinus infections.  Earwax buildup.  A foreign object in the ear.  Use of certain medicines.  Use of alcohol and caffeine.  High blood pressure.  Heart diseases.  Anemia.  Allergies.  Meniere disease.  Thyroid problems.  Tumors.  An enlarged part of a weakened blood vessel (aneurysm). SYMPTOMS The main symptom of tinnitus is  hearing a sound when there is no source for that sound. It may sound like:   Buzzing.  Roaring.  Ringing.  Blowing air, similar to the sound heard when you listen to a seashell.  Hissing.  Whistling.  Sizzling.  Humming.  Running water.  A sustained musical note. DIAGNOSIS  Tinnitus is diagnosed based on your symptoms. Your health care provider will do a physical exam. A comprehensive hearing exam (audiologic exam) will be done if your tinnitus:   Affects only one ear (unilateral).  Causes hearing difficulties.  Lasts 6 months or longer. You may also need to see a health care provider who specializes in hearing disorders (audiologist). You may be asked to complete a questionnaire to determine the severity of your tinnitus. Tests may be done to help determine the cause and to rule out other conditions. These can include:  Imaging studies of your head and brain, such as:  A CT scan.  An MRI.  An imaging study of your blood vessels (angiogram). TREATMENT  Treating an underlying medical condition can sometimes make tinnitus go away. If your tinnitus continues, other treatments may include:  Medicines, such as certain antidepressants or sleeping aids.  Sound generators to mask the tinnitus. These include:  Tabletop sound machines that play relaxing sounds to help you fall asleep.  Wearable devices that fit in your ear and play sounds or music.  A small device that uses headphones to deliver a signal embedded in music (acoustic neural stimulation). In time, this may change the pathways of your brain and make you less sensitive to tinnitus. This device is used for very severe cases when no other treatment is working.  Therapy and counseling to help you manage the stress of living with tinnitus.  Using hearing aids or cochlear implants, if your tinnitus is related to hearing loss. HOME CARE INSTRUCTIONS  When possible, avoid being in loud places and being exposed to  loud sounds.  Wear hearing protection, such as earplugs, when you are exposed to loud noises.  Do not take stimulants, such as nicotine, alcohol, or caffeine.  Practice techniques for reducing stress, such as meditation, yoga, or deep breathing.  Use a white noise machine, a humidifier, or other devices to mask the sound of tinnitus.  Sleep with your head slightly raised. This may reduce the impact of tinnitus.  Try to get plenty of rest each night. SEEK MEDICAL CARE IF:  You have tinnitus in just one ear.  Your tinnitus continues for 3 weeks or longer without stopping.  Home care measures are not helping.  You have tinnitus after a head injury.  You have tinnitus along with any of the following:  Dizziness.  Loss of balance.  Nausea and vomiting.   This information is not intended to replace advice given to you by your health care provider. Make sure you discuss any questions you have with your health care provider.   Document Released: 01/12/2005 Document Revised: 02/02/2014 Document Reviewed: 06/14/2013 Elsevier Interactive Patient Education 2016 [employer] Safe Sex Safe sex is  about reducing the risk of giving or getting a sexually transmitted disease (STD). STDs are spread through sexual contact involving the genitals, mouth, or rectum. Some STDs can be cured and others cannot. Safe sex can also prevent unintended pregnancies.  WHAT ARE SOME SAFE SEX PRACTICES?  Limit your sexual activity to only one partner who is having sex with only you.  Talk to your partner about his or her past partners, past STDs, and drug use.  Use a condom every time you have sexual intercourse. This includes vaginal, oral, and anal sexual activity. Both females and males should wear condoms during oral sex. Only use latex or polyurethane condoms and water-based lubricants. Using petroleum-based lubricants or oils to lubricate a condom will weaken the condom and increase the chance that it  will break. The condom should be in place from the beginning to the end of sexual activity. Wearing a condom reduces, but does not completely eliminate, your risk of getting or giving an STD. STDs can be spread by contact with infected body fluids and skin.  Get vaccinated for hepatitis B and HPV.  Avoid alcohol and recreational drugs, which can affect your judgment. You may forget to use a condom or participate in high-risk sex.  For females, avoid douching after sexual intercourse. Douching can spread an infection farther into the reproductive tract.  Check your body for signs of sores, blisters, rashes, or unusual discharge. See your health care provider if you notice any of these signs.  Avoid sexual contact if you have symptoms of an infection or are being treated for an STD. If you or your partner has herpes, avoid sexual contact when blisters are present. Use condoms at all other times.  If you are at risk of being infected with HIV, it is recommended that you take a prescription medicine daily to prevent HIV infection. This is called pre-exposure prophylaxis (PrEP). You are considered at risk if:  You are a man who has sex with other men (MSM).  You are a heterosexual man or woman who is sexually active with more than one partner.  You take drugs by injection.  You are sexually active with a partner who has HIV.  Talk with your health care provider about whether you are at high risk of being infected with HIV. If you choose to begin PrEP, you should first be tested for HIV. You should then be tested every 3 months for as long as you are taking PrEP.  See your health care provider for regular screenings, exams, and tests for other STDs. Before having sex with a new partner, each of you should be screened for STDs and should talk about the results with each other. WHAT ARE THE BENEFITS OF SAFE SEX?   There is less chance of getting or giving an STD.  You can prevent unwanted or  unintended pregnancies.  By discussing safe sex concerns with your partner, you may increase feelings of intimacy, comfort, trust, and honesty between the two of you.   This information is not intended to replace advice given to you by your health care provider. Make sure you discuss any questions you have with your health care provider.   Document Released: 02/20/2004 Document Revised: 02/02/2014 Document Reviewed: 07/06/2011 Elsevier Interactive Patient Education [employer]
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
## 2016-11-11 ENCOUNTER — Emergency Department (HOSPITAL_COMMUNITY)
Admission: EM | Admit: 2016-11-11 | Discharge: 2016-11-11 | Disposition: A | Discharge status: Home / Self Care | Payer: Medicaid Other | Attending: Emergency Medicine | Admitting: Emergency Medicine

## 2016-11-11 ENCOUNTER — Encounter (HOSPITAL_COMMUNITY): Payer: Self-pay | Admitting: Emergency Medicine

## 2016-11-11 DIAGNOSIS — J069 Acute upper respiratory infection, unspecified: Secondary | ICD-10-CM | POA: Diagnosis not present

## 2016-11-11 DIAGNOSIS — F17210 Nicotine dependence, cigarettes, uncomplicated: Secondary | ICD-10-CM | POA: Insufficient documentation

## 2016-11-11 DIAGNOSIS — R0981 Nasal congestion: Secondary | ICD-10-CM | POA: Insufficient documentation

## 2016-11-11 DIAGNOSIS — R509 Fever, unspecified: Secondary | ICD-10-CM | POA: Diagnosis present

## 2016-11-11 DIAGNOSIS — R05 Cough: Secondary | ICD-10-CM | POA: Diagnosis not present

## 2016-11-11 DIAGNOSIS — B9789 Other viral agents as the cause of diseases classified elsewhere: Secondary | ICD-10-CM | POA: Diagnosis not present

## 2016-11-11 NOTE — ED Notes (Signed)
Verbalized understanding discharge instructions and follow up. In no acute distress.

## 2016-11-11 NOTE — ED Provider Notes (Signed)
[facility]-EMERGENCY DEPT Provider Note   CSN: [id number] Arrival date & time: 11/11/16  0228     History   Chief Complaint Chief Complaint  Patient presents with   Nasal Congestion   Fever    HPI Christy Webb is a 26 y.o. female.  Patient presents with cough, nasal congestion x 3 days. She reports multiple family members are ill with the same symptoms. No fever, nausea, headache or SOB.    The history is provided by the patient. No language interpreter was used.    History reviewed. No pertinent past medical history.  There are no active problems to display for this patient.   Past Surgical History:  Procedure Laterality Date   HERNIA REPAIR      OB History    Gravida Para Term Preterm AB Living   1             SAB TAB Ectopic Multiple Live Births                   Home Medications    Prior to Admission medications   Medication Sig Start Date End Date Taking? Authorizing Provider  benzonatate (TESSALON PERLES) 100 MG capsule Take 1 capsule (100 mg total) by mouth 3 (three) times daily as needed for cough. 05/27/15   Danelle Berryapia, Leisa, PA-C    Family History History reviewed. No pertinent family history.  Social History Social History  Substance Use Topics   Smoking status: Current Some Day Smoker    Packs/day: 0.25    Years: 2.00    Types: Cigarettes   Smokeless tobacco: Never Used   Alcohol use Yes     Comment: 2-3 shots per week     Allergies   Penicillins   Review of Systems Review of Systems  Constitutional: Negative for chills and fever.  HENT: Positive for congestion. Negative for sore throat.   Respiratory: Positive for cough. Negative for shortness of breath and wheezing.   Cardiovascular: Negative.   Gastrointestinal: Negative.  Negative for nausea.  Musculoskeletal: Negative.  Negative for myalgias.  Skin: Negative.   Neurological: Negative.      Physical Exam Updated Vital Signs BP  (!) 130/100 (BP Location: Right Arm)    Pulse 87    Temp 98.8 F (37.1 C) (Oral)    Resp 18    Ht 4\' 9"  (1.448 m)    Wt 47.6 kg (105 lb)    LMP 11/10/2016    SpO2 96%    BMI 22.72 kg/m   Physical Exam  Constitutional: She is oriented to person, place, and time. She appears well-developed and well-nourished.  HENT:  Head: Normocephalic.  Neck: Normal range of motion. Neck supple.  Cardiovascular: Normal rate and regular rhythm.   Pulmonary/Chest: Effort normal and breath sounds normal. She has no wheezes. She has no rales.  Abdominal: Soft. Bowel sounds are normal. There is no tenderness. There is no rebound and no guarding.  Musculoskeletal: Normal range of motion.  Neurological: She is alert and oriented to person, place, and time.  Skin: Skin is warm and dry. No rash noted.  Psychiatric: She has a normal mood and affect.     ED Treatments / Results  Labs (all labs ordered are listed, but only abnormal results are displayed) Labs Reviewed - No data to display  EKG  EKG Interpretation None       Radiology No results found.  Procedures Procedures (including critical care time)  Medications Ordered  in ED Medications - No data to display   Initial Impression / Assessment and Plan / ED Course  I have reviewed the triage vital signs and the nursing notes.  Pertinent labs & imaging results that were available during my care of the patient were reviewed by me and considered in my medical decision making (see chart for details).     Well appearing patient with complaint of URI symptoms similar to multiple family members. VSS, afebrile. Likely viral process requiring supportive care.   Final Clinical Impressions(s) / ED Diagnoses   Final diagnoses:  Viral upper respiratory tract infection    New Prescriptions Discharge Medication List as of 11/11/2016  5:51 AM       Elpidio Anis, PA-C 11/11/16 1610    Palumbo, April, MD 11/11/16 9604

## 2016-11-11 NOTE — ED Triage Notes (Signed)
Patient complaining of coughing up mucous. Patient states her started Sunday. Patient is also complaining of sore throat. Patient is not having any other symptoms.

## 2017-06-17 ENCOUNTER — Encounter (HOSPITAL_COMMUNITY): Payer: Self-pay | Admitting: Emergency Medicine

## 2017-06-17 ENCOUNTER — Other Ambulatory Visit: Payer: Self-pay

## 2017-06-17 ENCOUNTER — Emergency Department (HOSPITAL_COMMUNITY)
Admission: EM | Admit: 2017-06-17 | Discharge: 2017-06-18 | Disposition: A | Discharge status: Home / Self Care | Payer: Medicaid Other | Attending: Emergency Medicine | Admitting: Emergency Medicine

## 2017-06-17 DIAGNOSIS — F1210 Cannabis abuse, uncomplicated: Secondary | ICD-10-CM | POA: Diagnosis not present

## 2017-06-17 DIAGNOSIS — H6692 Otitis media, unspecified, left ear: Secondary | ICD-10-CM | POA: Diagnosis not present

## 2017-06-17 DIAGNOSIS — J029 Acute pharyngitis, unspecified: Secondary | ICD-10-CM | POA: Insufficient documentation

## 2017-06-17 DIAGNOSIS — H9202 Otalgia, left ear: Secondary | ICD-10-CM | POA: Diagnosis present

## 2017-06-17 DIAGNOSIS — F17210 Nicotine dependence, cigarettes, uncomplicated: Secondary | ICD-10-CM | POA: Diagnosis not present

## 2017-06-17 NOTE — ED Notes (Signed)
Bed: ZO10 Expected date:  Expected time:  Means of arrival:  Comments:

## 2017-06-17 NOTE — ED Triage Notes (Signed)
Pt is c/o bilateral ear pain left worse than right, sore throat, and headache  Sxs started 2 days ago

## 2017-06-18 MED ORDER — CEFDINIR 300 MG PO CAPS: 300.0000 mg | ORAL_CAPSULE | Freq: Two times a day (BID) | ORAL | 0 refills | Status: DC

## 2017-06-18 MED ORDER — CEFDINIR 300 MG PO CAPS
300.0000 mg | ORAL_CAPSULE | Freq: Two times a day (BID) | ORAL | Status: DC
  Administered 2017-06-18: 300 mg via ORAL
  Filled 2017-06-18: qty 1

## 2017-06-18 NOTE — ED Provider Notes (Signed)
[facility]-EMERGENCY DEPT Provider Note   CSN: [id number] Arrival date & time: 06/17/17  2316     History   Chief Complaint Chief Complaint  Patient presents with   Otalgia   Sore Throat    HPI Christy Webb is a 27 y.o. female.  The history is provided by the patient and medical records.  Otalgia  Associated symptoms include sore throat.  Sore Throat     27 y.o. F here with ear pain and sore throat.  Patient states symptoms started about 2 days ago. Primarily pain in the left ear, started having some pain in the right ear pain.  States she has some chronic hearing loss in right ear, unchanged.  No trouble swallowing, has been eating and drinking well.  Subjective fevers last weekend, afebrile here.  No sick contacts.  History reviewed. No pertinent past medical history.  There are no active problems to display for this patient.   Past Surgical History:  Procedure Laterality Date   HERNIA REPAIR       OB History    Gravida  1   Para      Term      Preterm      AB      Living        SAB      TAB      Ectopic      Multiple      Live Births               Home Medications    Prior to Admission medications   Medication Sig Start Date End Date Taking? Authorizing Provider  ibuprofen (ADVIL,MOTRIN) 200 MG tablet Take 800 mg by mouth every 6 (six) hours as needed for moderate pain.   Yes [provider]    Family History History reviewed. No pertinent family history.  Social History Social History   Tobacco Use   Smoking status: Current Some Day Smoker    Packs/day: 0.25    Years: 2.00    Pack years: 0.50    Types: Cigarettes   Smokeless tobacco: Never Used  Substance Use Topics   Alcohol use: Yes    Comment: 2-3 shots per week   Drug use: Not Currently    Frequency: 3.5 times per week    Types: Marijuana     Allergies   Penicillins   Review of Systems Review of Systems   HENT: Positive for ear pain and sore throat.   All other systems reviewed and are negative.    Physical Exam Updated Vital Signs BP 117/77 (BP Location: Left Arm)    Pulse (!) 101    Temp 98.4 F (36.9 C) (Oral)    Resp 18    Ht  (1.448 m)    Wt 49.9 kg (110 lb)    LMP 06/05/2017 (Approximate)    SpO2 98%    BMI 23.80 kg/m   Physical Exam  Constitutional: She is oriented to person, place, and time. She appears well-developed and well-nourished.  HENT:  Head: Normocephalic and atraumatic.  Mouth/Throat: Oropharynx is clear and moist.  Left EAC and TM erythematous, no signs of perforation Right ear overall normal Tonsils overall normal in appearance bilaterally without exudate; uvula midline without evidence of peritonsillar abscess; handling secretions appropriately; no difficulty swallowing or speaking; normal phonation without stridor  Eyes: Pupils are equal, round, and reactive to light. Conjunctivae and EOM are normal.  Neck: Normal range of motion.  Cardiovascular: Normal rate, regular rhythm and normal heart sounds.  Pulmonary/Chest: Effort normal and breath sounds normal.  Abdominal: Soft. Bowel sounds are normal.  Musculoskeletal: Normal range of motion.  Neurological: She is alert and oriented to person, place, and time.  Skin: Skin is warm and dry.  Psychiatric: She has a normal mood and affect.  Nursing note and vitals reviewed.    ED Treatments / Results  Labs (all labs ordered are listed, but only abnormal results are displayed) Labs Reviewed - No data to display  EKG None  Radiology No results found.  Procedures Procedures (including critical care time)  Medications Ordered in ED Medications  cefdinir (OMNICEF) capsule 300 mg (has no administration in time range)     Initial Impression / Assessment and Plan / ED Course  I have reviewed the triage vital signs and the nursing notes.  Pertinent labs & imaging results that were available during  my care of the patient were reviewed by me and considered in my medical decision making (see chart for details).  27 year old female here with ear pain and sore throat.  Majority of her pain is in the left ear.  Does have signs of otitis media on exam.  No signs of perforation.  Tonsils are normal without edema or exudate.  She is handling secretions well.  Normal phonation without stridor.  Will start on Omnicef which would adequately cover for OM and potential strep pharyngitis.  Follow-up with PCP.  Discussed plan with patient, she acknowledged understanding and agreed with plan of care.  Return precautions given for new or worsening symptoms.  Final Clinical Impressions(s) / ED Diagnoses   Final diagnoses:  Acute infection of left ear    ED Discharge Orders        Ordered    cefdinir (OMNICEF) 300 MG capsule  2 times daily     06/18/17 0058       Garlon Hatchet, PA-C 06/18/17 0139    Geoffery Lyons, MD 06/18/17 [phone]

## 2017-06-18 NOTE — Discharge Instructions (Signed)
Take the prescribed medication as directed. °Follow-up with your primary care doctor. °Return to the ED for new or worsening symptoms. °

## 2017-09-05 ENCOUNTER — Emergency Department (HOSPITAL_COMMUNITY)
Admission: EM | Admit: 2017-09-05 | Discharge: 2017-09-05 | Discharge status: Absent without leave | Payer: Managed Care, Other (non HMO)

## 2017-09-05 ENCOUNTER — Emergency Department (HOSPITAL_COMMUNITY): Payer: Medicaid Other

## 2017-09-05 ENCOUNTER — Other Ambulatory Visit: Payer: Self-pay

## 2017-09-05 ENCOUNTER — Encounter (HOSPITAL_COMMUNITY): Payer: Self-pay

## 2017-09-05 ENCOUNTER — Emergency Department (HOSPITAL_COMMUNITY)
Admission: EM | Admit: 2017-09-05 | Discharge: 2017-09-05 | Disposition: A | Discharge status: Home / Self Care | Payer: Medicaid Other | Attending: Emergency Medicine | Admitting: Emergency Medicine

## 2017-09-05 DIAGNOSIS — F17210 Nicotine dependence, cigarettes, uncomplicated: Secondary | ICD-10-CM | POA: Diagnosis not present

## 2017-09-05 DIAGNOSIS — S0083XA Contusion of other part of head, initial encounter: Secondary | ICD-10-CM | POA: Insufficient documentation

## 2017-09-05 DIAGNOSIS — Y998 Other external cause status: Secondary | ICD-10-CM | POA: Diagnosis not present

## 2017-09-05 DIAGNOSIS — F1210 Cannabis abuse, uncomplicated: Secondary | ICD-10-CM | POA: Insufficient documentation

## 2017-09-05 DIAGNOSIS — R51 Headache: Secondary | ICD-10-CM | POA: Diagnosis not present

## 2017-09-05 DIAGNOSIS — W228XXA Striking against or struck by other objects, initial encounter: Secondary | ICD-10-CM | POA: Diagnosis not present

## 2017-09-05 DIAGNOSIS — Y9389 Activity, other specified: Secondary | ICD-10-CM | POA: Diagnosis not present

## 2017-09-05 DIAGNOSIS — Y9289 Other specified places as the place of occurrence of the external cause: Secondary | ICD-10-CM | POA: Diagnosis not present

## 2017-09-05 DIAGNOSIS — S0990XA Unspecified injury of head, initial encounter: Secondary | ICD-10-CM | POA: Diagnosis present

## 2017-09-05 MED ORDER — NAPROXEN 375 MG PO TABS: 375.0000 mg | ORAL_TABLET | Freq: Two times a day (BID) | ORAL | 0 refills | Status: DC

## 2017-09-05 MED ORDER — HYDROCODONE-ACETAMINOPHEN 5-325 MG PO TABS
1.0000 | ORAL_TABLET | Freq: Once | ORAL | Status: AC
  Administered 2017-09-05: 1 via ORAL
  Filled 2017-09-05: qty 1

## 2017-09-05 MED ORDER — TRAMADOL HCL 50 MG PO TABS: 50.0000 mg | ORAL_TABLET | Freq: Four times a day (QID) | ORAL | 0 refills | Status: DC | PRN

## 2017-09-05 NOTE — Discharge Instructions (Addendum)
Your CT scan today is normal. Take the medication as directed and follow up with your doctor.

## 2017-09-05 NOTE — ED Triage Notes (Signed)
Pt c/o headache (pt reports migraine) since last Saturday and intermittent nausea, no vomiting and dizziness. Pt states she fell and hit her head last Saturday. Doesn't know if she lost consciousness. A/Ox4, ambulatory. C/o pain to left side of head.

## 2017-09-05 NOTE — ED Provider Notes (Signed)
[facility]-EMERGENCY DEPT Provider Note   CSN: [id number] Arrival date & time: 09/05/17  1755     History   Chief Complaint No chief complaint on file.   HPI Christy Webb is a 27 y.o. female who presents to the ED for a headache. Patient reports that last week she was playing with her younger sister and hit her head on a door knob. She thought the pain would go away but today decided to take advil for the pain. She reports taking 4 advil without relief. Patient denies n/v or syncopal episode.  HPI  History reviewed. No pertinent past medical history.  There are no active problems to display for this patient.   Past Surgical History:  Procedure Laterality Date   HERNIA REPAIR       OB History    Gravida  1   Para      Term      Preterm      AB      Living        SAB      TAB      Ectopic      Multiple      Live Births               Home Medications    Prior to Admission medications   Medication Sig Start Date End Date Taking? Authorizing Provider  etonogestrel (NEXPLANON) 68 MG IMPL implant 1 each by Subdermal route once.   Yes [provider]  ibuprofen (ADVIL,MOTRIN) 200 MG tablet Take 800 mg by mouth every 6 (six) hours as needed for moderate pain.   Yes [provider]  naproxen (NAPROSYN) 375 MG tablet Take 1 tablet (375 mg total) by mouth 2 (two) times daily. 09/05/17   Janne Napoleon, NP  traMADol (ULTRAM) 50 MG tablet Take 1 tablet (50 mg total) by mouth every 6 (six) hours as needed. 09/05/17   Janne Napoleon, NP    Family History History reviewed. No pertinent family history.  Social History Social History   Tobacco Use   Smoking status: Current Some Day Smoker    Packs/day: 0.25    Years: 2.00    Pack years: 0.50    Types: Cigarettes   Smokeless tobacco: Never Used  Substance Use Topics   Alcohol use: Yes    Comment: 2-3 shots per week   Drug use: Not Currently     Frequency: 3.5 times per week    Types: Marijuana     Allergies   Penicillins   Review of Systems Review of Systems  Skin:       contusion  Neurological: Positive for headaches.  All other systems reviewed and are negative.    Physical Exam Updated Vital Signs BP 123/82 (BP Location: Left Arm)    Pulse (!) 103    Temp 98.4 F (36.9 C) (Oral)    Resp 16    Ht 4\' 9"  (1.448 m)    Wt 47.6 kg    LMP 09/04/2017    SpO2 96%    BMI 22.72 kg/m   Physical Exam  Constitutional: She is oriented to person, place, and time. She appears well-developed and well-nourished. No distress.  HENT:  Right Ear: Tympanic membrane normal.  Left Ear: Tympanic membrane normal.  Nose: Nose normal.  Mouth/Throat: Uvula is midline, oropharynx is clear and moist and mucous membranes are normal.  Contusion left forehead  Eyes: Pupils are equal, round, and reactive to  light. Conjunctivae and EOM are normal.  Neck: Normal range of motion. Neck supple.  Cardiovascular: Normal rate and regular rhythm.  Pulmonary/Chest: Effort normal. She has no wheezes. She has no rales.  Abdominal: Soft. There is no tenderness.  Musculoskeletal: She exhibits no edema.  Radial and pedal pulses strong, adequate circulation, good touch sensation. Grips are equal.  Neurological: She is alert and oriented to person, place, and time. She has normal strength. No cranial nerve deficit or sensory deficit. She displays a negative Romberg sign. Coordination and gait normal.  Reflex Scores:      Bicep reflexes are 2+ on the right side and 2+ on the left side.      Brachioradialis reflexes are 2+ on the right side and 2+ on the left side.      Patellar reflexes are 2+ on the right side and 2+ on the left side. Rapid alternating movement without difficulty. Stands on one foot without difficulty.  Psychiatric: She has a normal mood and affect. Her behavior is normal.     ED Treatments / Results  Labs (all labs ordered are listed,  but only abnormal results are displayed) Labs Reviewed - No data to display  Radiology Ct Head Wo Contrast  Result Date: 09/05/2017 CLINICAL DATA:  Posttraumatic with headache. EXAM: CT HEAD WITHOUT CONTRAST TECHNIQUE: Contiguous axial images were obtained from the base of the skull through the vertex without intravenous contrast. COMPARISON:  None. FINDINGS: Brain: No evidence of acute infarction, hemorrhage, hydrocephalus, extra-axial collection or mass lesion/mass effect. Vascular: No hyperdense vessel or unexpected calcification. Skull: Normal. Negative for fracture or focal lesion. Sinuses/Orbits: No acute finding. Other: None. IMPRESSION: Normal head CT Electronically Signed   By: Sherian ReinWei-Chen  Lin M.D.   On: 09/05/2017 20:17    Procedures Procedures (including critical care time)  Medications Ordered in ED Medications  HYDROcodone-acetaminophen (NORCO/VICODIN) 5-325 MG per tablet 1 tablet (1 tablet Oral Given 09/05/17 2006)     Initial Impression / Assessment and Plan / ED Course  I have reviewed the triage vital signs and the nursing notes. 27 y.o. female here with headache s/p hitting her head one week ago. Patient stable for d/c with normal CT scan and normal neuro exam. Patient to f/u with PCP. Return precautions discussed.  Final Clinical Impressions(s) / ED Diagnoses   Final diagnoses:  Contusion of forehead, initial encounter    ED Discharge Orders         Ordered    naproxen (NAPROSYN) 375 MG tablet  2 times daily     09/05/17 2037    traMADol (ULTRAM) 50 MG tablet  Every 6 hours PRN     09/05/17 2037           Kerrie Buffaloeese, Kieth Hartis GoteboM, NP 09/05/17 2040    Mancel BaleWentz, Elliott, MD 09/06/17 1045

## 2017-09-05 NOTE — ED Notes (Signed)
Pt states she has to go to her car for something.

## 2018-10-18 IMAGING — CT CT HEAD W/O CM
3 series · 15 of 43 positions shown, 18 images · non-contrast
Comparison: None.

CLINICAL DATA: Posttraumatic with headache.

EXAM:
CT HEAD WITHOUT CONTRAST
TECHNIQUE: Contiguous axial images were obtained from the base of the skull
through the vertex without intravenous contrast.

[Series 2: head wo · axial · 0.39mm/px · z∈[+1430,+1535]mm · 9 of 26 slices shown, 12 images]
[im 3/26  brain]
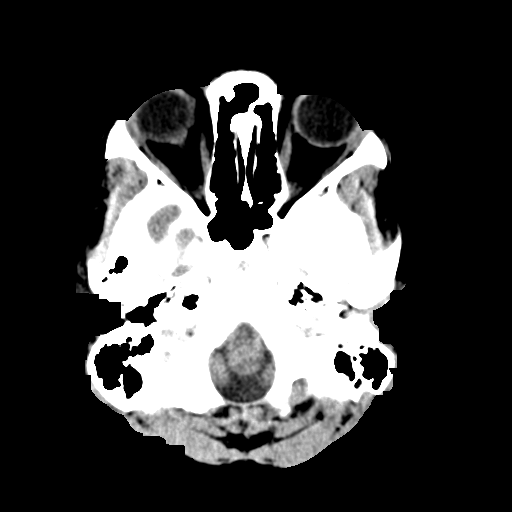
[im 3/26  bone]
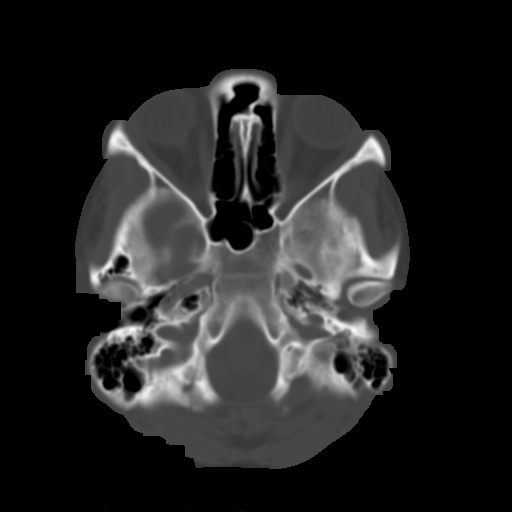
[im 6/26  brain]
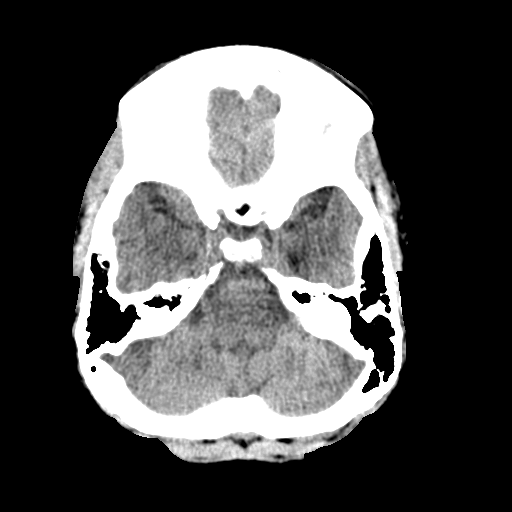
[im 8/26  brain]
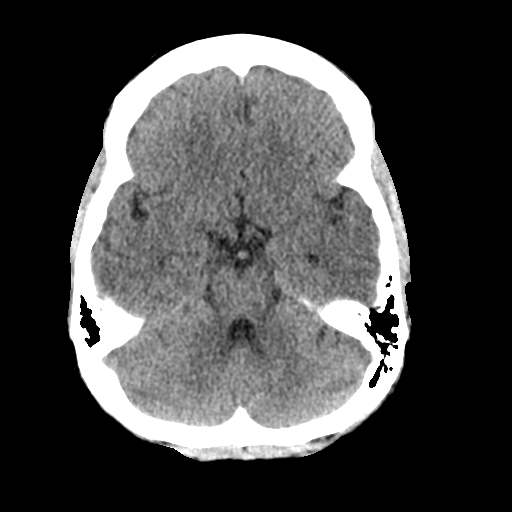
[im 11/26  brain]
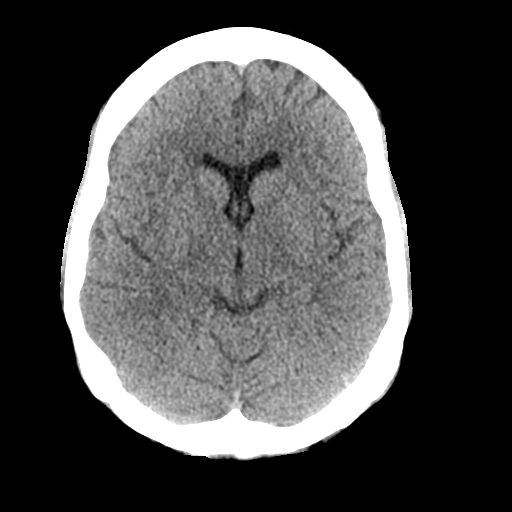
[im 14/26  brain]
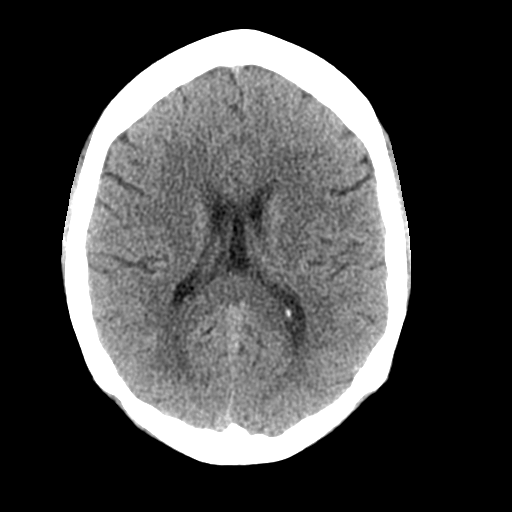
[im 14/26  bone]
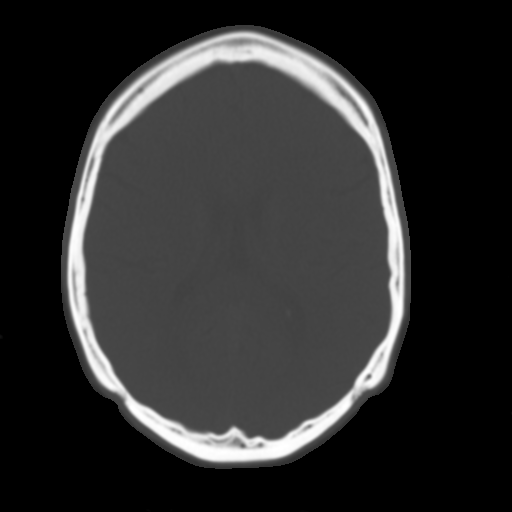
[im 16/26  brain]
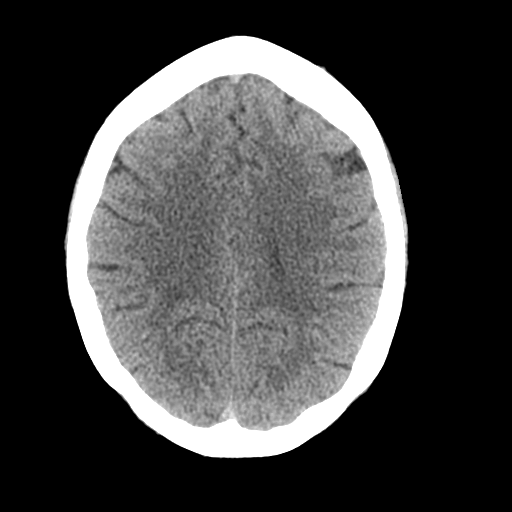
[im 19/26  brain]
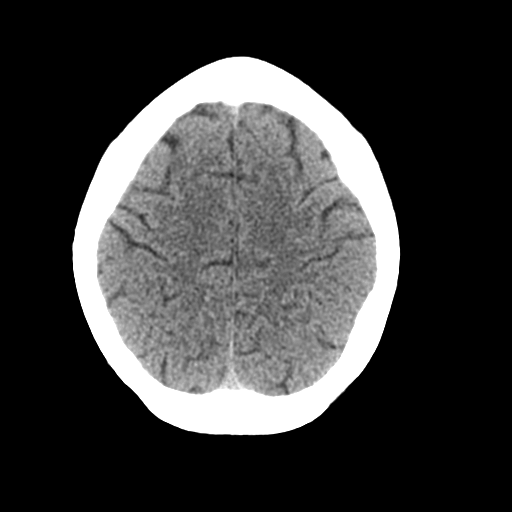
[im 22/26  brain]
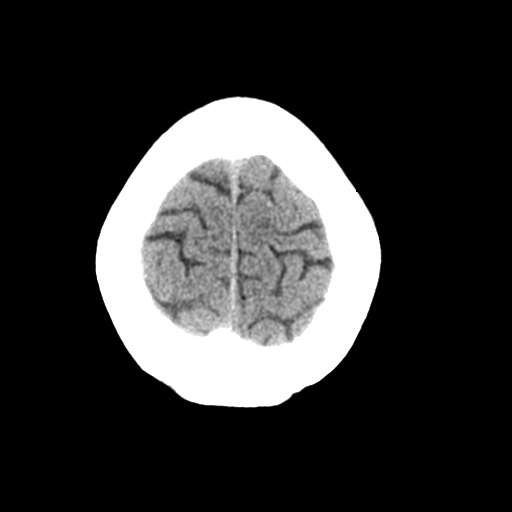
[im 24/26  brain]
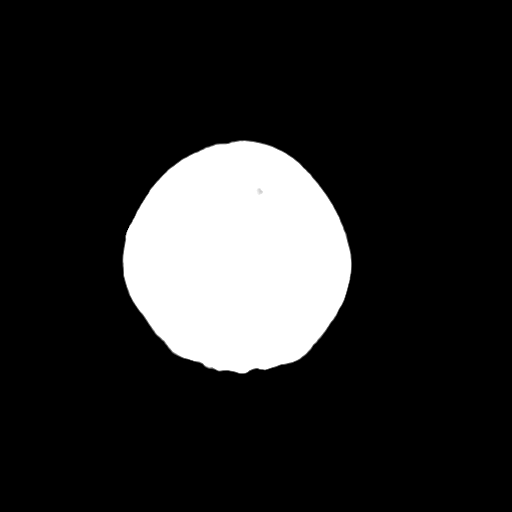
[im 24/26  bone]
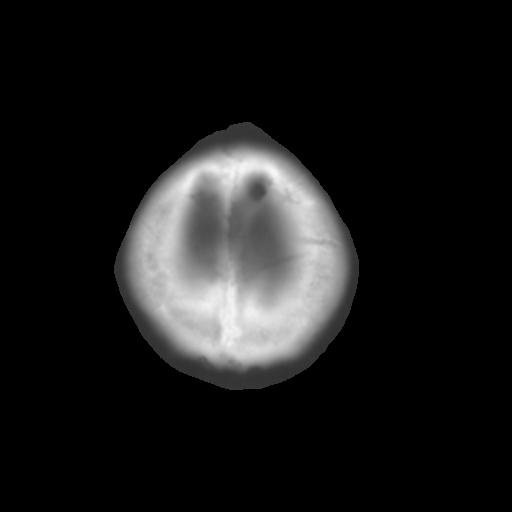

[Series 4: coronal soft tissue · coronal · 0.26mm/px · 3 of 58 slices shown]
[im 20/58  brain]
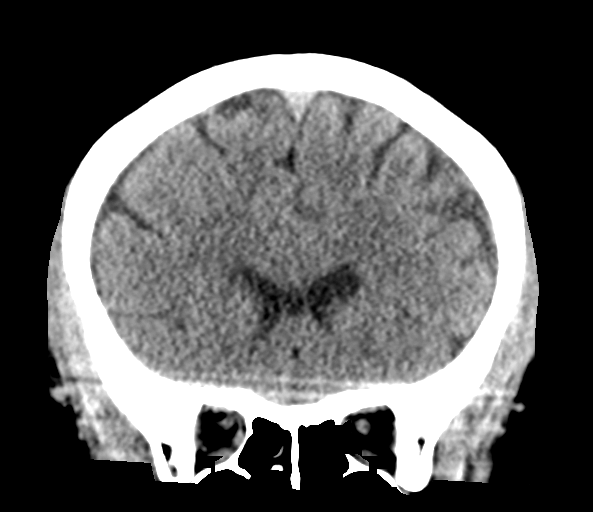
[im 26/58  brain]
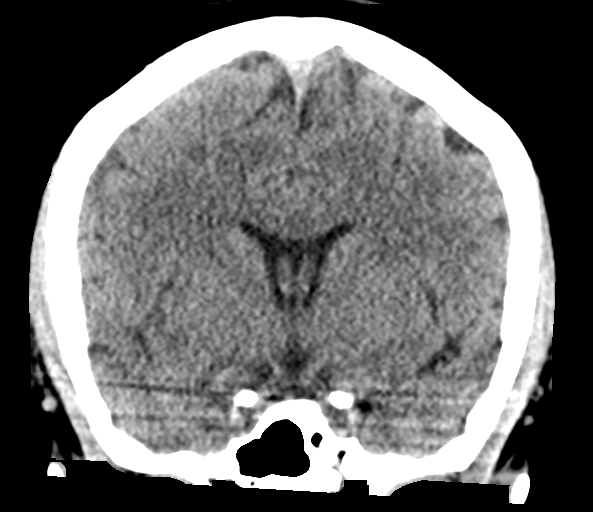
[im 32/58  brain]
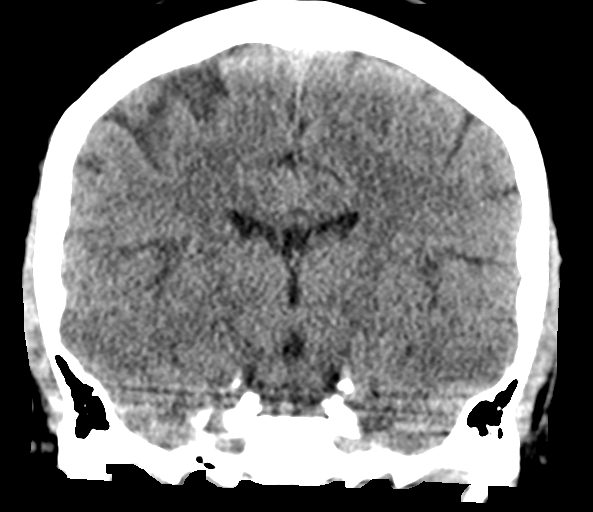

[Series 5: sagittal soft tissue · sagittal · 0.27mm/px · 3 of 47 slices shown]
[im 16/47  brain]
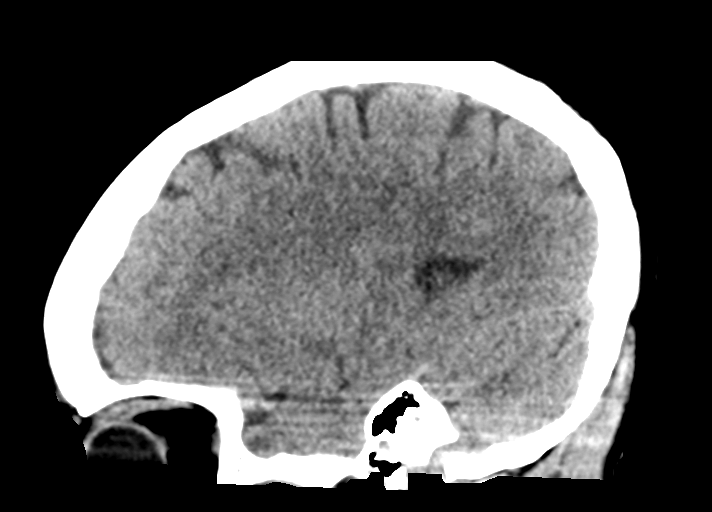
[im 24/47  brain]
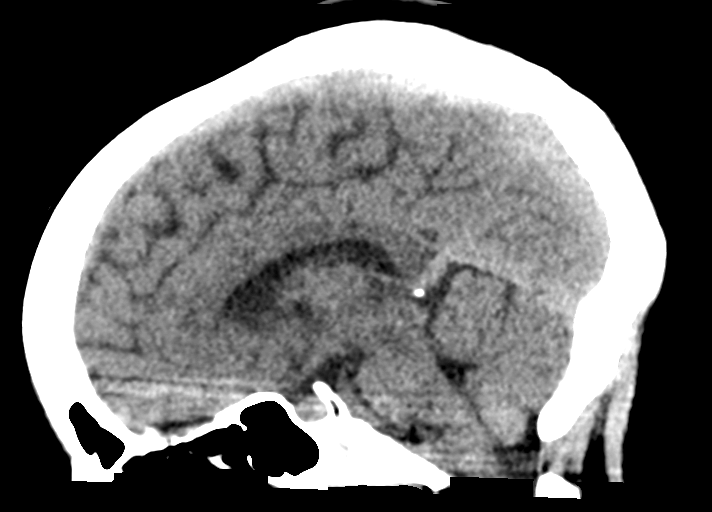
[im 31/47  brain]
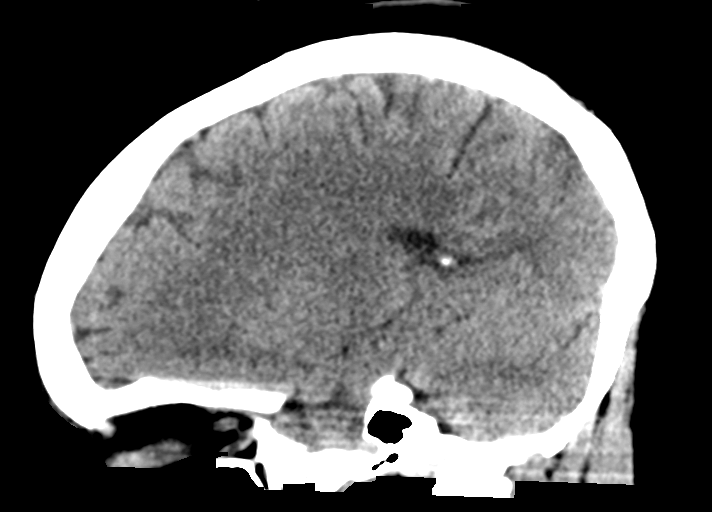

[15 of 43 positions shown; findings below may reference images not displayed]

FINDINGS: Brain: No evidence of acute infarction, hemorrhage, hydrocephalus,
extra-axial collection or mass lesion/mass effect.

Vascular: No hyperdense vessel or unexpected calcification.

Skull: Normal. Negative for fracture or focal lesion.

Sinuses/Orbits: No acute finding.

Other: None.
IMPRESSION: Normal head CT

## 2019-01-06 ENCOUNTER — Emergency Department (HOSPITAL_COMMUNITY)
Admission: EM | Admit: 2019-01-06 | Discharge: 2019-01-06 | Disposition: A | Discharge status: Home / Self Care | Payer: Medicaid Other | Attending: Emergency Medicine | Admitting: Emergency Medicine

## 2019-01-06 ENCOUNTER — Emergency Department (HOSPITAL_COMMUNITY): Payer: Medicaid Other

## 2019-01-06 ENCOUNTER — Other Ambulatory Visit: Payer: Self-pay

## 2019-01-06 DIAGNOSIS — Z79899 Other long term (current) drug therapy: Secondary | ICD-10-CM | POA: Insufficient documentation

## 2019-01-06 DIAGNOSIS — S40021A Contusion of right upper arm, initial encounter: Secondary | ICD-10-CM | POA: Insufficient documentation

## 2019-01-06 DIAGNOSIS — F17210 Nicotine dependence, cigarettes, uncomplicated: Secondary | ICD-10-CM | POA: Insufficient documentation

## 2019-01-06 DIAGNOSIS — Y929 Unspecified place or not applicable: Secondary | ICD-10-CM | POA: Insufficient documentation

## 2019-01-06 DIAGNOSIS — Y999 Unspecified external cause status: Secondary | ICD-10-CM | POA: Insufficient documentation

## 2019-01-06 DIAGNOSIS — R0789 Other chest pain: Secondary | ICD-10-CM | POA: Insufficient documentation

## 2019-01-06 DIAGNOSIS — T07XXXA Unspecified multiple injuries, initial encounter: Secondary | ICD-10-CM

## 2019-01-06 DIAGNOSIS — Y939 Activity, unspecified: Secondary | ICD-10-CM | POA: Insufficient documentation

## 2019-01-06 DIAGNOSIS — S40022A Contusion of left upper arm, initial encounter: Secondary | ICD-10-CM | POA: Insufficient documentation

## 2019-01-06 DIAGNOSIS — S59912A Unspecified injury of left forearm, initial encounter: Secondary | ICD-10-CM | POA: Diagnosis present

## 2019-01-06 NOTE — Discharge Instructions (Addendum)
Use Tylenol and ibuprofen as needed for pain. Use ice to help with pain and swelling. Return to the emergency room if you develop severe worsening pain, difficulty breathing, any new, worsening, or concerning symptoms.
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
## 2020-02-01 ENCOUNTER — Ambulatory Visit: Payer: Managed Care, Other (non HMO) | Admitting: Family Medicine

## 2021-06-12 ENCOUNTER — Emergency Department (HOSPITAL_BASED_OUTPATIENT_CLINIC_OR_DEPARTMENT_OTHER)
Admission: EM | Admit: 2021-06-12 | Discharge: 2021-06-12 | Disposition: A | Discharge status: Home / Self Care | Payer: Medicaid Other | Attending: Emergency Medicine | Admitting: Emergency Medicine

## 2021-06-12 ENCOUNTER — Encounter (HOSPITAL_BASED_OUTPATIENT_CLINIC_OR_DEPARTMENT_OTHER): Payer: Self-pay

## 2021-06-12 ENCOUNTER — Other Ambulatory Visit: Payer: Self-pay

## 2021-06-12 DIAGNOSIS — H1089 Other conjunctivitis: Secondary | ICD-10-CM | POA: Insufficient documentation

## 2021-06-12 DIAGNOSIS — H1032 Unspecified acute conjunctivitis, left eye: Secondary | ICD-10-CM

## 2021-06-12 DIAGNOSIS — H5712 Ocular pain, left eye: Secondary | ICD-10-CM | POA: Diagnosis present

## 2021-06-12 MED ORDER — HYDROXYZINE HCL 25 MG PO TABS: 25.0000 mg | ORAL_TABLET | Freq: Four times a day (QID) | ORAL | 0 refills | Status: DC | PRN

## 2021-06-12 MED ORDER — CEPHALEXIN 500 MG PO CAPS: 500.0000 mg | ORAL_CAPSULE | Freq: Four times a day (QID) | ORAL | 0 refills | Status: DC

## 2021-06-12 MED ORDER — DEXAMETHASONE 4 MG PO TABS
10.0000 mg | ORAL_TABLET | Freq: Once | ORAL | Status: AC
  Administered 2021-06-12: 10 mg via ORAL
  Filled 2021-06-12: qty 3

## 2021-06-12 MED ORDER — ERYTHROMYCIN 5 MG/GM OP OINT: TOPICAL_OINTMENT | OPHTHALMIC | 0 refills | Status: DC

## 2021-06-12 NOTE — ED Provider Notes (Signed)
[facility] EMERGENCY DEPARTMENT Provider Note   CSN: [id number] Arrival date & time: 06/12/21  1234     History  Chief Complaint  Patient presents with   Conjunctivitis    Christy Webb is a 31 y.o. female who presents emergency department complaining of left eye pain and swelling.  Patient states that she has been using some over-the-counter pinkeye relief, initially thought that her symptoms were improving.  She overall noticed the symptoms starting approximately 4 days ago.  While she describes her normal visual acuity as "trash", she states that she has been having some more blurry vision in the left eye as well. Pt reports dog at home currently has a rash and patient herself has had some generalized itching on her arms and legs.    Conjunctivitis      Home Medications Prior to Admission medications   Medication Sig Start Date End Date Taking? Authorizing Provider  cephALEXin (KEFLEX) 500 MG capsule Take 1 capsule (500 mg total) by mouth 4 (four) times daily. 06/12/21  Yes Nickole Adamek T, PA-C  erythromycin ophthalmic ointment Place a 1/2 inch ribbon of ointment into the lower eyelid. 06/12/21  Yes Ejay Lashley T, PA-C  hydrOXYzine (ATARAX) 25 MG tablet Take 1 tablet (25 mg total) by mouth every 6 (six) hours as needed for itching. 06/12/21  Yes Jimy Gates T, PA-C  etonogestrel (NEXPLANON) 68 MG IMPL implant 1 each by Subdermal route once.    [provider]  fluconazole (DIFLUCAN) 150 MG tablet Take 150 mg by mouth once. May repeat in one week if symptoms persist/recur     [provider]  ibuprofen (ADVIL,MOTRIN) 200 MG tablet Take 800 mg by mouth every 6 (six) hours as needed for moderate pain.    [provider]  naproxen (NAPROSYN) 375 MG tablet Take 1 tablet (375 mg total) by mouth 2 (two) times daily. 09/05/17   Janne Napoleon, NP  traMADol (ULTRAM) 50 MG tablet Take 1 tablet (50 mg total) by mouth every 6 (six)  hours as needed. 09/05/17   Janne Napoleon, NP      Allergies    Penicillins    Review of Systems   Review of Systems  Constitutional:  Negative for chills and fever.  Eyes:  Positive for pain, discharge, redness and itching. Negative for visual disturbance.  All other systems reviewed and are negative.  Physical Exam Updated Vital Signs BP (!) 131/99 (BP Location: Left Arm)   Pulse 99   Temp 98.7 F (37.1 C) (Oral)   Resp 18   Ht 4\' 9"  (1.448 m)   Wt 45.4 kg   SpO2 99%   BMI 21.64 kg/m  Physical Exam Vitals and nursing note reviewed.  Constitutional:      Appearance: Normal appearance.  HENT:     Head: Normocephalic and atraumatic.  Eyes:     Conjunctiva/sclera:     Left eye: Left conjunctiva is injected. Exudate present.     Comments: Significant erythema and injection of left conjunctiva with active yellow drainage. Swelling and erythema of upper left eyelid. No lower lid involvement.   Pulmonary:     Effort: Pulmonary effort is normal. No respiratory distress.  Skin:    General: Skin is warm and dry.  Neurological:     Mental Status: She is alert.  Psychiatric:        Mood and Affect: Mood normal.        Behavior: Behavior normal.  ED Results / Procedures / Treatments   Labs (all labs ordered are listed, but only abnormal results are displayed) Labs Reviewed - No data to display  EKG None  Radiology No results found.  Procedures Procedures    Medications Ordered in ED Medications  dexamethasone (DECADRON) tablet 10 mg (has no administration in time range)    ED Course/ Medical Decision Making/ A&P                           Medical Decision Making  This patient is a 31 y.o. female who presents to the ED for concern of left eye redness and swelling.   Differential diagnoses prior to evaluation: Conjunctivitis, preseptal cellulitis, orbital cellulitis, eye trauma  Past Medical History / Co-morbidities / Social History: History reviewed. No  pertinent past medical history.  Physical Exam: Physical exam performed. The pertinent findings include: Significant erythema and injection of left conjunctiva with active yellow drainage. Swelling and erythema of upper left eyelid.    Disposition: After consideration of the diagnostic results and the patients response to treatment, I feel that patient is not requiring admission or inpatient treatment for her symptoms. Patient's symptoms are likely related to either bacterial or allergic conjunctivitis. Will go ahead and treat with antibiotics and refer to ophthalmology if symptoms persist. Low suspicion for acute preseptal, periorbital or orbital cellulitis. Discussed reasons to return to the emergency department, and the patient is agreeable to the plan.   I discussed this case with my attending physician Dr. Dalene Seltzer who cosigned this note including patient's presenting symptoms, physical exam, and planned diagnostics and interventions. Attending physician stated agreement with plan or made changes to plan which were implemented.    Final Clinical Impression(s) / ED Diagnoses Final diagnoses:  Acute bacterial conjunctivitis of left eye    Rx / DC Orders ED Discharge Orders          Ordered    hydrOXYzine (ATARAX) 25 MG tablet  Every 6 hours PRN        06/12/21 1608    erythromycin ophthalmic ointment        06/12/21 1608    cephALEXin (KEFLEX) 500 MG capsule  4 times daily        06/12/21 1608           Portions of this report may have been transcribed using voice recognition software. Every effort was made to ensure accuracy; however, inadvertent computerized transcription errors may be present.    Jeanella Flattery 06/12/21 1615    Alvira Monday, MD 06/13/21 1207

## 2021-06-12 NOTE — Discharge Instructions (Addendum)
You were seen in the emergency department for an eye problem.   We are giving you both a topical antibiotic as well as one by mouth to cover for any infections of your left eye. I am also prescribing you a medicine you can take every 6 hours as needed for itching.  If your symptoms persist, please follow up with the eye doctor. I've attached their contact information.  Continue to monitor how you're doing and return to the ER for new or worsening symptoms.

## 2021-06-12 NOTE — ED Triage Notes (Signed)
Patient arrives POV c/o left eye pain that started on Sunday. Upon visualization, left eye sclera is red and irritated with left upper eye lid swollen. Pt reports blurry vision in left eye.

## 2022-09-04 ENCOUNTER — Encounter (HOSPITAL_COMMUNITY): Payer: Self-pay | Admitting: Emergency Medicine

## 2022-09-04 ENCOUNTER — Ambulatory Visit (INDEPENDENT_AMBULATORY_CARE_PROVIDER_SITE_OTHER): Payer: MEDICAID

## 2022-09-04 ENCOUNTER — Ambulatory Visit (HOSPITAL_COMMUNITY)
Admission: EM | Admit: 2022-09-04 | Discharge: 2022-09-04 | Disposition: A | Discharge status: Home / Self Care | Payer: MEDICAID | Attending: Internal Medicine | Admitting: Internal Medicine

## 2022-09-04 DIAGNOSIS — S93401A Sprain of unspecified ligament of right ankle, initial encounter: Secondary | ICD-10-CM | POA: Diagnosis not present

## 2022-09-04 DIAGNOSIS — M25571 Pain in right ankle and joints of right foot: Secondary | ICD-10-CM

## 2022-09-04 NOTE — ED Provider Notes (Signed)
[facility]    CSN: [id number] Arrival date & time: 09/04/22  1152     History   Chief Complaint Chief Complaint  Patient presents with   Ankle Pain    HPI Christy Webb is a 32 y.o. female.  Twisted her right ankle 2 weeks ago Since then having pain and swelling without improvement Has used tylenol, ibuprofen, ice Using ankle brace that's not comfortable  She does have a newborn baby and is on her feet a lot for work  Denies prior injury  History reviewed. No pertinent past medical history.  Patient Active Problem List   Diagnosis Date Noted   VAGINITIS 06/08/2008   VAGINAL DISCHARGE 06/08/2008    Past Surgical History:  Procedure Laterality Date   HERNIA REPAIR      OB History     Gravida  1   Para  0   Term  0   Preterm  0   AB  0   Living         SAB  0   IAB  0   Ectopic  0   Multiple      Live Births               Home Medications    Prior to Admission medications   Medication Sig Start Date End Date Taking? Authorizing Provider  etonogestrel (NEXPLANON) 68 MG IMPL implant 1 each by Subdermal route once.   Yes [provider]    Family History Family History  Problem Relation Age of Onset   Irritable bowel syndrome Mother    Bipolar disorder Mother    Diabetes Maternal Grandfather    Hypertension Maternal Grandfather    Diabetes Maternal Uncle     Social History Social History   Tobacco Use   Smoking status: Every Day    Current packs/day: 0.25    Average packs/day: 0.3 packs/day for 2.0 years (0.5 ttl pk-yrs)    Types: Cigarettes   Smokeless tobacco: Never  Vaping Use   Vaping status: Never Used  Substance Use Topics   Alcohol use: Yes    Alcohol/week: 15.0 standard drinks of alcohol    Types: 15 Shots of liquor per week    Comment: 2 bottles of tequila per week   Drug use: Not Currently    Frequency: 2.0 times per week    Types: Marijuana     Allergies    Penicillins   Review of Systems Review of Systems Per HPI  Physical Exam Triage Vital Signs ED Triage Vitals  Encounter Vitals Group     BP 09/04/22 1253 115/81     Systolic BP Percentile --      Diastolic BP Percentile --      Pulse Rate 09/04/22 1253 82     Resp 09/04/22 1253 18     Temp 09/04/22 1253 98.7 F (37.1 C)     Temp Source 09/04/22 1253 Oral     SpO2 09/04/22 1253 100 %     Weight 09/04/22 1255 106 lb (48.1 kg)     Height 09/04/22 1255 4\' 10"  (1.473 m)     Head Circumference --      Peak Flow --      Pain Score 09/04/22 1255 4     Pain Loc --      Pain Education --      Exclude from Growth Chart --    No data found.  Updated Vital Signs BP 115/81 (BP  Location: Right Arm)   Pulse 82   Temp 98.7 F (37.1 C) (Oral)   Resp 18   Ht 4\' 10"  (1.473 m)   Wt 106 lb (48.1 kg)   LMP 07/31/2022   SpO2 100%   Breastfeeding No   BMI 22.15 kg/m    Physical Exam Vitals and nursing note reviewed.  Constitutional:      General: She is not in acute distress. HENT:     Mouth/Throat:     Pharynx: Oropharynx is clear.  Cardiovascular:     Rate and Rhythm: Normal rate and regular rhythm.     Pulses: Normal pulses.  Pulmonary:     Effort: Pulmonary effort is normal.  Musculoskeletal:     Cervical back: Normal range of motion.     Comments: Good active and passive ROM of right ankle. No bony tenderness ankles, mid foot, toes. No obvious swelling or deformity. Distal sensation intact. Strong DP pulse. Cap refill 2-3 seconds   Skin:    Capillary Refill: Capillary refill takes 2 to 3 seconds.     Comments: Wearing slides, toes are cold. Rest of foot is warm and dry  Neurological:     Mental Status: She is alert and oriented to person, place, and time.     UC Treatments / Results  Labs (all labs ordered are listed, but only abnormal results are displayed) Labs Reviewed - No data to display  EKG  Radiology DG Ankle Complete Right  Result Date:  09/04/2022 CLINICAL DATA:  Right ankle pain.Twisted right ankle 2 weeks ago. EXAM: RIGHT ANKLE - COMPLETE 3+ VIEW COMPARISON:  Right ankle radiographs 01/15/2007 FINDINGS: The ankle mortise is symmetric and intact. Joint spaces are preserved. Resolution of the prior lateral malleolar soft tissue swelling on remote 2008 radiographs. No acute fracture dislocation. IMPRESSION: No acute fracture or dislocation. Electronically Signed   By: Neita Garnet M.D.   On: 09/04/2022 14:45    Procedures Procedures (including critical care time)  Medications Ordered in UC Medications - No data to display  Initial Impression / Assessment and Plan / UC Course  I have reviewed the triage vital signs and the nursing notes.  Pertinent labs & imaging results that were available during my care of the patient were reviewed by me and considered in my medical decision making (see chart for details).  Right ankle xray negative per provider first read. Awaiting radiology final read. Discussed soft tissue injury, RICE therapy Continue ibu and tylenol. ACE wrap in clinic, patient reports more comfortable than her brace Follow up with orthopedics, provided with clinic info Work note provide No questions at this time    2:45PM radiology report negative   Final Clinical Impressions(s) / UC Diagnoses   Final diagnoses:  Sprain of right ankle, unspecified ligament, initial encounter     Discharge Instructions      Rest - try to avoid heavy lifting and high impact activity Ice - apply for 20 minutes a few times daily Compression - use ace wrap when standing/walking Elevation - prop up on a pillow  Take ibuprofen and tylenol every 6 hours for pain, inflammation, swelling  Please follow up with the orthopedic clinic     ED Prescriptions   None    PDMP not reviewed this encounter.   Arthea Nobel, Lurena Joiner, [place] 09/04/22 [phone]

## 2022-09-04 NOTE — Discharge Instructions (Addendum)
Rest - try to avoid heavy lifting and high impact activity Ice - apply for 20 minutes a few times daily Compression - use ace wrap when standing/walking Elevation - prop up on a pillow  Take ibuprofen and tylenol every 6 hours for pain, inflammation, swelling  Please follow up with the orthopedic clinic

## 2022-09-04 NOTE — ED Triage Notes (Signed)
Patient states that she twisted her right ankle x 2 weeks ago carrying her baby upstairs.  She has continued to work on it but it is painful and swelling.  Patient has taken Tylenol for pain.
